# Patient Record
Sex: Female | Born: 1945
Health system: Southern US, Community
[De-identification: ages and names within clinical notes are randomized; demographics above are authoritative.]

## PROBLEM LIST (undated history)

## (undated) DIAGNOSIS — R7303 Prediabetes: Secondary | ICD-10-CM

## (undated) DIAGNOSIS — J189 Pneumonia, unspecified organism: Secondary | ICD-10-CM

## (undated) DIAGNOSIS — E785 Hyperlipidemia, unspecified: Secondary | ICD-10-CM

## (undated) DIAGNOSIS — E215 Disorder of parathyroid gland, unspecified: Secondary | ICD-10-CM

## (undated) DIAGNOSIS — A630 Anogenital (venereal) warts: Secondary | ICD-10-CM

## (undated) DIAGNOSIS — G43909 Migraine, unspecified, not intractable, without status migrainosus: Secondary | ICD-10-CM

## (undated) DIAGNOSIS — I10 Essential (primary) hypertension: Secondary | ICD-10-CM

## (undated) DIAGNOSIS — N870 Mild cervical dysplasia: Secondary | ICD-10-CM

## (undated) DIAGNOSIS — A4902 Methicillin resistant Staphylococcus aureus infection, unspecified site: Secondary | ICD-10-CM

## (undated) DIAGNOSIS — F32A Depression, unspecified: Secondary | ICD-10-CM

## (undated) DIAGNOSIS — F329 Major depressive disorder, single episode, unspecified: Secondary | ICD-10-CM

## (undated) DIAGNOSIS — IMO0002 Reserved for concepts with insufficient information to code with codable children: Secondary | ICD-10-CM

## (undated) DIAGNOSIS — N939 Abnormal uterine and vaginal bleeding, unspecified: Secondary | ICD-10-CM

## (undated) HISTORY — PX: GYNECOLOGIC CRYOSURGERY: SHX857

## (undated) HISTORY — DX: Depression, unspecified: F32.A

## (undated) HISTORY — DX: Mild cervical dysplasia: N87.0

## (undated) HISTORY — DX: Essential (primary) hypertension: I10

## (undated) HISTORY — PX: TOTAL HIP ARTHROPLASTY: SHX124

## (undated) HISTORY — DX: Reserved for concepts with insufficient information to code with codable children: IMO0002

## (undated) HISTORY — PX: CERVICAL SPINE SURGERY: SHX589

## (undated) HISTORY — DX: Hypercalcemia: E83.52

## (undated) HISTORY — DX: Methicillin resistant Staphylococcus aureus infection, unspecified site: A49.02

## (undated) HISTORY — DX: Major depressive disorder, single episode, unspecified: F32.9

## (undated) HISTORY — DX: Anogenital (venereal) warts: A63.0

## (undated) HISTORY — DX: Abnormal uterine and vaginal bleeding, unspecified: N93.9

## (undated) HISTORY — DX: Hyperlipidemia, unspecified: E78.5

## (undated) HISTORY — DX: Migraine, unspecified, not intractable, without status migrainosus: G43.909

---

## 1998-05-05 ENCOUNTER — Ambulatory Visit (HOSPITAL_COMMUNITY): Admission: RE | Admit: 1998-05-05 | Discharge: 1998-05-05 | Payer: Self-pay | Admitting: Obstetrics and Gynecology

## 1998-05-27 ENCOUNTER — Ambulatory Visit (HOSPITAL_COMMUNITY): Admission: RE | Admit: 1998-05-27 | Discharge: 1998-05-27 | Payer: Self-pay | Admitting: Obstetrics and Gynecology

## 1998-08-12 ENCOUNTER — Encounter: Payer: Self-pay | Admitting: Internal Medicine

## 1998-08-12 ENCOUNTER — Encounter: Admission: RE | Admit: 1998-08-12 | Discharge: 1998-11-10 | Payer: Self-pay | Admitting: Internal Medicine

## 1998-08-25 ENCOUNTER — Encounter: Admission: RE | Admit: 1998-08-25 | Discharge: 1998-11-23 | Payer: Self-pay | Admitting: *Deleted

## 1999-05-10 ENCOUNTER — Encounter: Payer: Self-pay | Admitting: Obstetrics and Gynecology

## 1999-05-10 ENCOUNTER — Ambulatory Visit (HOSPITAL_COMMUNITY): Admission: RE | Admit: 1999-05-10 | Discharge: 1999-05-10 | Payer: Self-pay | Admitting: Obstetrics and Gynecology

## 1999-06-06 ENCOUNTER — Other Ambulatory Visit: Admission: RE | Admit: 1999-06-06 | Discharge: 1999-06-06 | Payer: Self-pay | Admitting: Obstetrics and Gynecology

## 1999-12-01 ENCOUNTER — Encounter: Admission: RE | Admit: 1999-12-01 | Discharge: 1999-12-29 | Payer: Self-pay | Admitting: *Deleted

## 2000-05-16 ENCOUNTER — Ambulatory Visit (HOSPITAL_COMMUNITY): Admission: RE | Admit: 2000-05-16 | Discharge: 2000-05-16 | Payer: Self-pay | Admitting: Obstetrics and Gynecology

## 2000-05-16 ENCOUNTER — Encounter: Payer: Self-pay | Admitting: Obstetrics and Gynecology

## 2000-06-07 ENCOUNTER — Other Ambulatory Visit: Admission: RE | Admit: 2000-06-07 | Discharge: 2000-06-07 | Payer: Self-pay | Admitting: Obstetrics and Gynecology

## 2001-04-30 ENCOUNTER — Emergency Department (HOSPITAL_COMMUNITY): Admission: EM | Admit: 2001-04-30 | Discharge: 2001-04-30 | Payer: Self-pay | Admitting: Emergency Medicine

## 2001-04-30 ENCOUNTER — Encounter: Payer: Self-pay | Admitting: Emergency Medicine

## 2001-05-20 ENCOUNTER — Encounter: Payer: Self-pay | Admitting: Obstetrics and Gynecology

## 2001-05-20 ENCOUNTER — Ambulatory Visit (HOSPITAL_COMMUNITY): Admission: RE | Admit: 2001-05-20 | Discharge: 2001-05-20 | Payer: Self-pay | Admitting: Obstetrics and Gynecology

## 2001-06-07 ENCOUNTER — Other Ambulatory Visit: Admission: RE | Admit: 2001-06-07 | Discharge: 2001-06-07 | Payer: Self-pay | Admitting: Obstetrics and Gynecology

## 2001-06-14 ENCOUNTER — Encounter: Payer: Self-pay | Admitting: Obstetrics and Gynecology

## 2001-06-14 ENCOUNTER — Encounter: Admission: RE | Admit: 2001-06-14 | Discharge: 2001-06-14 | Payer: Self-pay | Admitting: Obstetrics and Gynecology

## 2002-02-24 ENCOUNTER — Ambulatory Visit (HOSPITAL_COMMUNITY): Admission: RE | Admit: 2002-02-24 | Discharge: 2002-02-24 | Payer: Self-pay | Admitting: Orthopedic Surgery

## 2002-02-24 ENCOUNTER — Encounter: Payer: Self-pay | Admitting: Orthopedic Surgery

## 2002-03-31 ENCOUNTER — Encounter: Admission: RE | Admit: 2002-03-31 | Discharge: 2002-06-26 | Payer: Self-pay | Admitting: Orthopedic Surgery

## 2002-06-05 ENCOUNTER — Ambulatory Visit (HOSPITAL_COMMUNITY): Admission: RE | Admit: 2002-06-05 | Discharge: 2002-06-05 | Payer: Self-pay | Admitting: Obstetrics and Gynecology

## 2002-06-05 ENCOUNTER — Encounter: Payer: Self-pay | Admitting: Obstetrics and Gynecology

## 2002-07-09 ENCOUNTER — Other Ambulatory Visit: Admission: RE | Admit: 2002-07-09 | Discharge: 2002-07-09 | Payer: Self-pay | Admitting: Obstetrics and Gynecology

## 2002-09-04 ENCOUNTER — Encounter: Payer: Self-pay | Admitting: Neurosurgery

## 2002-09-04 ENCOUNTER — Ambulatory Visit (HOSPITAL_COMMUNITY): Admission: RE | Admit: 2002-09-04 | Discharge: 2002-09-04 | Payer: Self-pay | Admitting: Neurosurgery

## 2002-09-05 ENCOUNTER — Encounter: Payer: Self-pay | Admitting: Neurosurgery

## 2002-09-05 ENCOUNTER — Ambulatory Visit (HOSPITAL_COMMUNITY): Admission: RE | Admit: 2002-09-05 | Discharge: 2002-09-05 | Payer: Self-pay | Admitting: Neurosurgery

## 2002-09-10 ENCOUNTER — Encounter: Payer: Self-pay | Admitting: Neurosurgery

## 2002-09-12 ENCOUNTER — Encounter: Payer: Self-pay | Admitting: Neurosurgery

## 2002-09-12 ENCOUNTER — Inpatient Hospital Stay (HOSPITAL_COMMUNITY): Admission: RE | Admit: 2002-09-12 | Discharge: 2002-09-14 | Payer: Self-pay | Admitting: Neurosurgery

## 2003-06-09 ENCOUNTER — Ambulatory Visit (HOSPITAL_COMMUNITY): Admission: RE | Admit: 2003-06-09 | Discharge: 2003-06-09 | Payer: Self-pay | Admitting: Obstetrics and Gynecology

## 2003-06-09 ENCOUNTER — Encounter: Payer: Self-pay | Admitting: Obstetrics and Gynecology

## 2003-07-14 ENCOUNTER — Other Ambulatory Visit: Admission: RE | Admit: 2003-07-14 | Discharge: 2003-07-14 | Payer: Self-pay | Admitting: Obstetrics and Gynecology

## 2003-10-07 ENCOUNTER — Ambulatory Visit (HOSPITAL_COMMUNITY): Admission: RE | Admit: 2003-10-07 | Discharge: 2003-10-07 | Payer: Self-pay | Admitting: Gastroenterology

## 2004-07-11 ENCOUNTER — Ambulatory Visit (HOSPITAL_COMMUNITY): Admission: RE | Admit: 2004-07-11 | Discharge: 2004-07-11 | Payer: Self-pay | Admitting: Obstetrics and Gynecology

## 2004-07-18 ENCOUNTER — Other Ambulatory Visit: Admission: RE | Admit: 2004-07-18 | Discharge: 2004-07-18 | Payer: Self-pay | Admitting: Obstetrics and Gynecology

## 2005-07-12 ENCOUNTER — Ambulatory Visit (HOSPITAL_COMMUNITY): Admission: RE | Admit: 2005-07-12 | Discharge: 2005-07-12 | Payer: Self-pay | Admitting: Obstetrics and Gynecology

## 2005-07-19 ENCOUNTER — Other Ambulatory Visit: Admission: RE | Admit: 2005-07-19 | Discharge: 2005-07-19 | Payer: Self-pay | Admitting: Obstetrics and Gynecology

## 2005-09-27 ENCOUNTER — Encounter: Admission: RE | Admit: 2005-09-27 | Discharge: 2005-09-27 | Payer: Self-pay | Admitting: Occupational Medicine

## 2006-07-17 ENCOUNTER — Ambulatory Visit (HOSPITAL_COMMUNITY): Admission: RE | Admit: 2006-07-17 | Discharge: 2006-07-17 | Payer: Self-pay | Admitting: Obstetrics and Gynecology

## 2006-07-23 ENCOUNTER — Other Ambulatory Visit: Admission: RE | Admit: 2006-07-23 | Discharge: 2006-07-23 | Payer: Self-pay | Admitting: Obstetrics and Gynecology

## 2007-07-30 ENCOUNTER — Ambulatory Visit (HOSPITAL_COMMUNITY): Admission: RE | Admit: 2007-07-30 | Discharge: 2007-07-30 | Payer: Self-pay | Admitting: Obstetrics and Gynecology

## 2007-10-24 DIAGNOSIS — A4902 Methicillin resistant Staphylococcus aureus infection, unspecified site: Secondary | ICD-10-CM

## 2007-10-24 HISTORY — DX: Methicillin resistant Staphylococcus aureus infection, unspecified site: A49.02

## 2007-10-24 HISTORY — PX: SPINE SURGERY: SHX786

## 2008-04-14 ENCOUNTER — Ambulatory Visit (HOSPITAL_COMMUNITY): Admission: RE | Admit: 2008-04-14 | Discharge: 2008-04-14 | Payer: Self-pay | Admitting: Neurosurgery

## 2008-04-23 ENCOUNTER — Ambulatory Visit (HOSPITAL_COMMUNITY): Admission: RE | Admit: 2008-04-23 | Discharge: 2008-04-23 | Payer: Self-pay | Admitting: Neurosurgery

## 2008-07-30 ENCOUNTER — Ambulatory Visit (HOSPITAL_COMMUNITY): Admission: RE | Admit: 2008-07-30 | Discharge: 2008-07-30 | Payer: Self-pay | Admitting: Obstetrics and Gynecology

## 2008-09-08 ENCOUNTER — Inpatient Hospital Stay (HOSPITAL_COMMUNITY): Admission: RE | Admit: 2008-09-08 | Discharge: 2008-09-12 | Payer: Self-pay | Admitting: Neurosurgery

## 2008-10-09 ENCOUNTER — Ambulatory Visit: Payer: Self-pay | Admitting: Internal Medicine

## 2008-10-09 ENCOUNTER — Inpatient Hospital Stay (HOSPITAL_COMMUNITY): Admission: EM | Admit: 2008-10-09 | Discharge: 2008-10-20 | Payer: Self-pay | Admitting: Internal Medicine

## 2008-10-09 ENCOUNTER — Ambulatory Visit: Payer: Self-pay | Admitting: Diagnostic Radiology

## 2008-10-09 ENCOUNTER — Encounter: Payer: Self-pay | Admitting: Emergency Medicine

## 2008-10-11 DIAGNOSIS — L089 Local infection of the skin and subcutaneous tissue, unspecified: Secondary | ICD-10-CM | POA: Insufficient documentation

## 2008-10-12 ENCOUNTER — Ambulatory Visit: Payer: Self-pay | Admitting: Infectious Diseases

## 2008-10-30 ENCOUNTER — Ambulatory Visit: Payer: Self-pay | Admitting: Internal Medicine

## 2008-11-02 ENCOUNTER — Encounter: Payer: Self-pay | Admitting: Internal Medicine

## 2008-11-09 ENCOUNTER — Inpatient Hospital Stay (HOSPITAL_COMMUNITY): Admission: AD | Admit: 2008-11-09 | Discharge: 2008-11-23 | Payer: Self-pay | Admitting: Neurosurgery

## 2008-11-09 ENCOUNTER — Ambulatory Visit: Payer: Self-pay | Admitting: Pulmonary Disease

## 2008-11-09 ENCOUNTER — Encounter: Payer: Self-pay | Admitting: Infectious Diseases

## 2008-11-14 ENCOUNTER — Ambulatory Visit: Payer: Self-pay | Admitting: Vascular Surgery

## 2008-11-14 ENCOUNTER — Encounter: Payer: Self-pay | Admitting: Critical Care Medicine

## 2008-11-14 HISTORY — PX: OTHER SURGICAL HISTORY: SHX169

## 2008-11-17 ENCOUNTER — Encounter (INDEPENDENT_AMBULATORY_CARE_PROVIDER_SITE_OTHER): Payer: Self-pay | Admitting: Neurosurgery

## 2008-11-25 ENCOUNTER — Encounter: Payer: Self-pay | Admitting: Infectious Diseases

## 2008-11-30 ENCOUNTER — Encounter: Payer: Self-pay | Admitting: Infectious Diseases

## 2008-12-02 ENCOUNTER — Ambulatory Visit: Payer: Self-pay | Admitting: Infectious Diseases

## 2008-12-02 DIAGNOSIS — E785 Hyperlipidemia, unspecified: Secondary | ICD-10-CM

## 2008-12-04 ENCOUNTER — Encounter: Payer: Self-pay | Admitting: Infectious Diseases

## 2008-12-05 ENCOUNTER — Ambulatory Visit (HOSPITAL_COMMUNITY): Admission: RE | Admit: 2008-12-05 | Discharge: 2008-12-05 | Payer: Self-pay | Admitting: Neurosurgery

## 2008-12-15 ENCOUNTER — Encounter: Payer: Self-pay | Admitting: Infectious Diseases

## 2008-12-16 ENCOUNTER — Ambulatory Visit: Payer: Self-pay | Admitting: Infectious Diseases

## 2008-12-24 ENCOUNTER — Observation Stay (HOSPITAL_COMMUNITY): Admission: RE | Admit: 2008-12-24 | Discharge: 2008-12-25 | Payer: Self-pay | Admitting: Neurosurgery

## 2009-01-27 ENCOUNTER — Ambulatory Visit: Payer: Self-pay | Admitting: Infectious Diseases

## 2009-01-27 LAB — CONVERTED CEMR LAB
ALT: 10 units/L (ref 0–35)
Basophils Absolute: 0 10*3/uL (ref 0.0–0.1)
Basophils Relative: 1 % (ref 0–1)
CO2: 22 meq/L (ref 19–32)
Calcium: 11.3 mg/dL — ABNORMAL HIGH (ref 8.4–10.5)
Chloride: 105 meq/L (ref 96–112)
Cholesterol: 296 mg/dL — ABNORMAL HIGH (ref 0–200)
Creatinine, Ser: 0.84 mg/dL (ref 0.40–1.20)
Eosinophils Relative: 8 % — ABNORMAL HIGH (ref 0–5)
Glucose, Bld: 66 mg/dL — ABNORMAL LOW (ref 70–99)
HCT: 35.9 % — ABNORMAL LOW (ref 36.0–46.0)
Hemoglobin: 11.5 g/dL — ABNORMAL LOW (ref 12.0–15.0)
MCHC: 32 g/dL (ref 30.0–36.0)
MCV: 85.7 fL (ref 78.0–100.0)
Monocytes Absolute: 0.6 10*3/uL (ref 0.1–1.0)
Monocytes Relative: 8 % (ref 3–12)
RBC: 4.19 M/uL (ref 3.87–5.11)
RDW: 15.5 % (ref 11.5–15.5)
Sed Rate: 43 mm/hr — ABNORMAL HIGH (ref 0–22)
Sodium: 140 meq/L (ref 135–145)
Total Bilirubin: 0.3 mg/dL (ref 0.3–1.2)
Total Protein: 8.4 g/dL — ABNORMAL HIGH (ref 6.0–8.3)
Triglycerides: 295 mg/dL — ABNORMAL HIGH (ref <150)
VLDL: 59 mg/dL — ABNORMAL HIGH (ref 0–40)

## 2009-02-03 ENCOUNTER — Telehealth: Payer: Self-pay | Admitting: Infectious Diseases

## 2009-06-11 ENCOUNTER — Ambulatory Visit (HOSPITAL_COMMUNITY): Admission: RE | Admit: 2009-06-11 | Discharge: 2009-06-11 | Payer: Self-pay | Admitting: Neurosurgery

## 2009-07-26 ENCOUNTER — Ambulatory Visit: Payer: Self-pay | Admitting: Infectious Diseases

## 2009-08-03 ENCOUNTER — Encounter (HOSPITAL_BASED_OUTPATIENT_CLINIC_OR_DEPARTMENT_OTHER): Admission: RE | Admit: 2009-08-03 | Discharge: 2009-09-06 | Payer: Self-pay | Admitting: Internal Medicine

## 2009-08-13 ENCOUNTER — Encounter: Payer: Self-pay | Admitting: Infectious Diseases

## 2009-08-25 ENCOUNTER — Ambulatory Visit (HOSPITAL_COMMUNITY): Admission: RE | Admit: 2009-08-25 | Discharge: 2009-08-25 | Payer: Self-pay | Admitting: Obstetrics and Gynecology

## 2009-11-26 ENCOUNTER — Encounter: Payer: Self-pay | Admitting: Infectious Diseases

## 2009-12-03 ENCOUNTER — Telehealth: Payer: Self-pay | Admitting: Infectious Diseases

## 2009-12-16 IMAGING — CR DG LUMBAR SPINE COMPLETE W/ BEND
7 series · 7 of 7 positions shown · non-contrast
Comparison: MR 04/15/2008.

CLINICAL DATA: Left-sided back pain radiating down left leg.  No
history of trauma provided.

LUMBAR SPINE - COMPLETE WITH BENDING VIEWS

[w l-spine flexion/extension (1 of 2)]
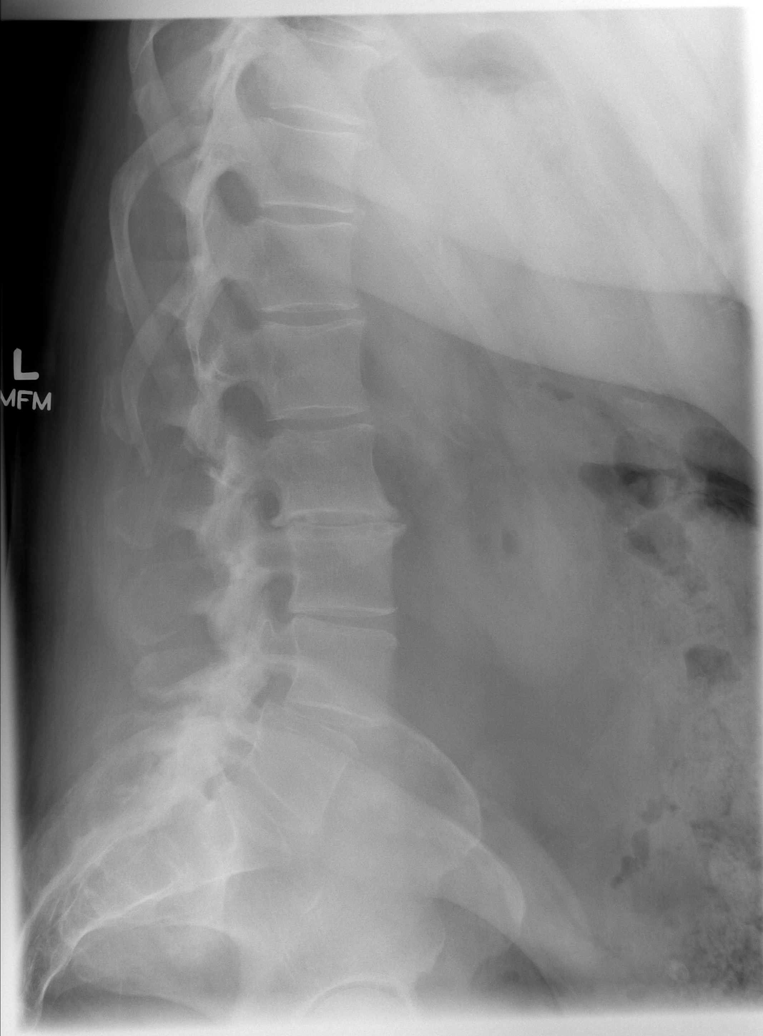

[w l-spine flexion/extension (2 of 2)]
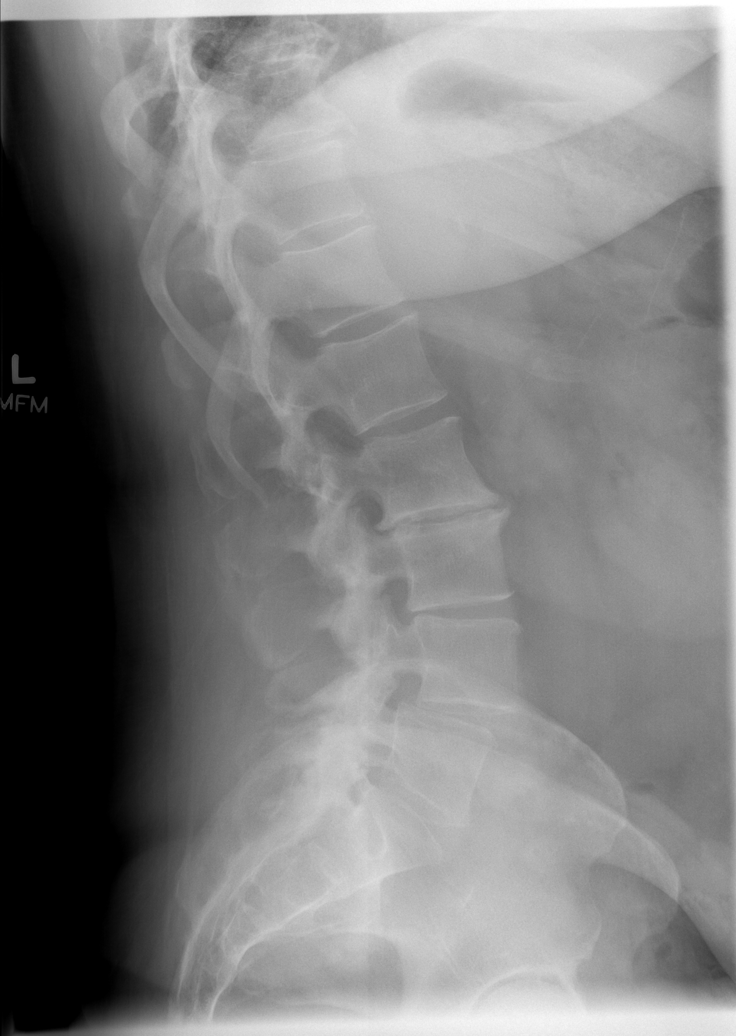

[t l-spine a.p.]
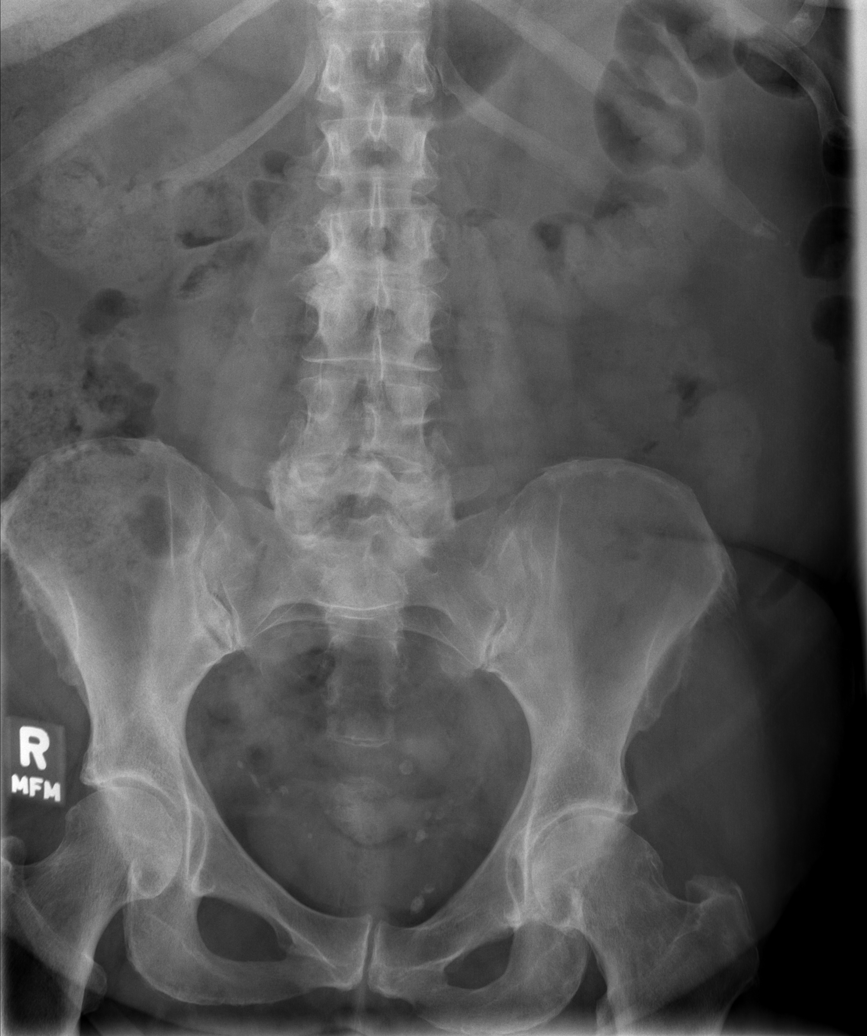

[t l-spine oblique exposure (1 of 2)]
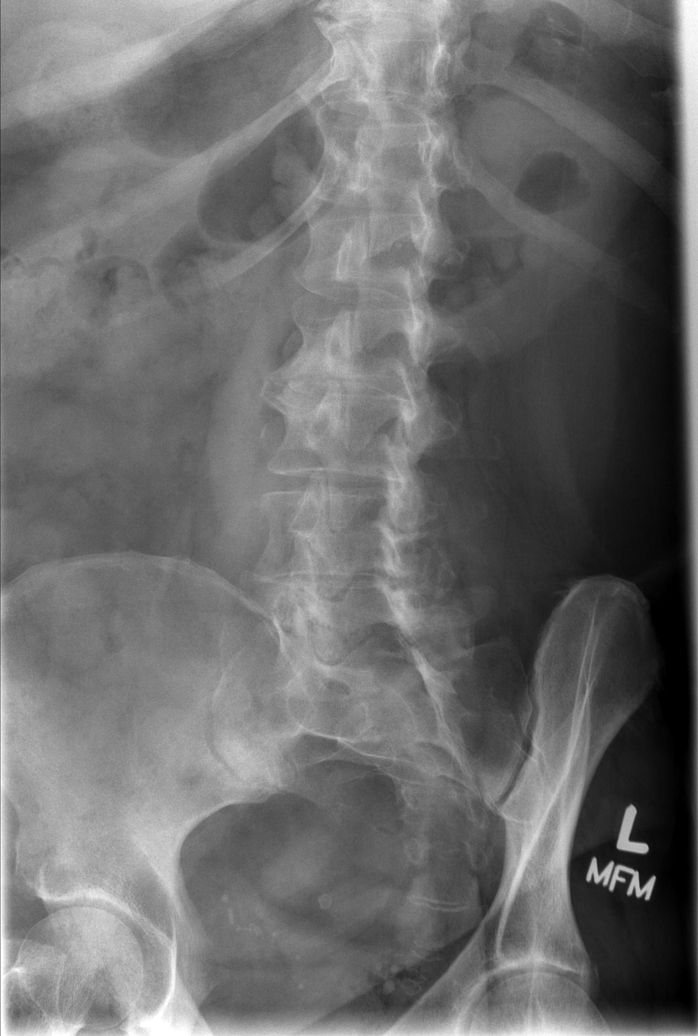

[t l-spine oblique exposure (2 of 2)]
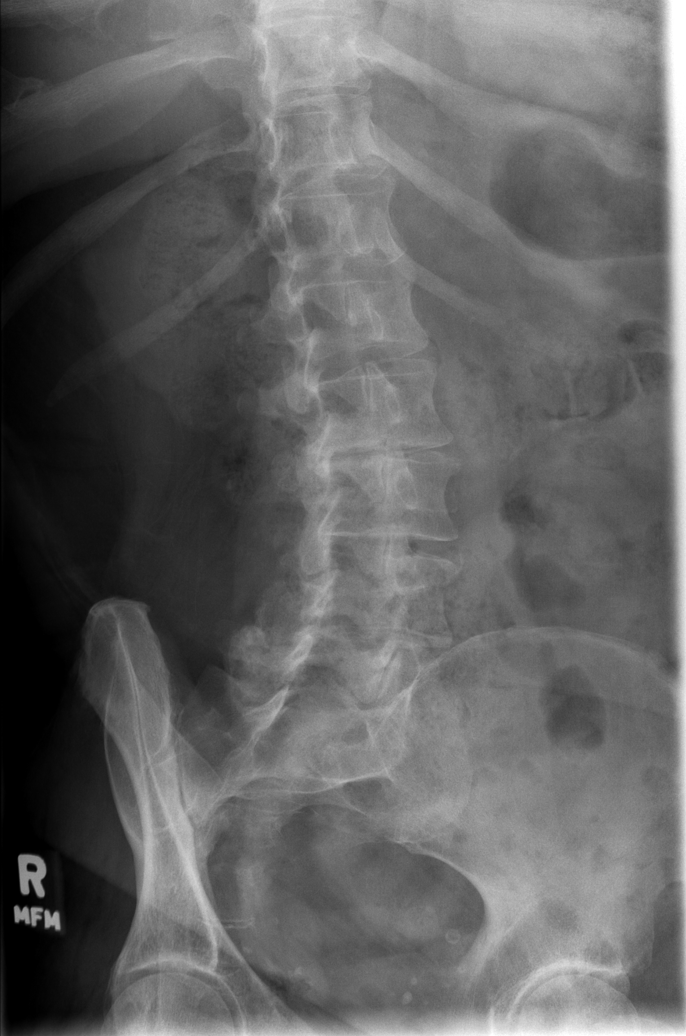

[t l-spine lat]
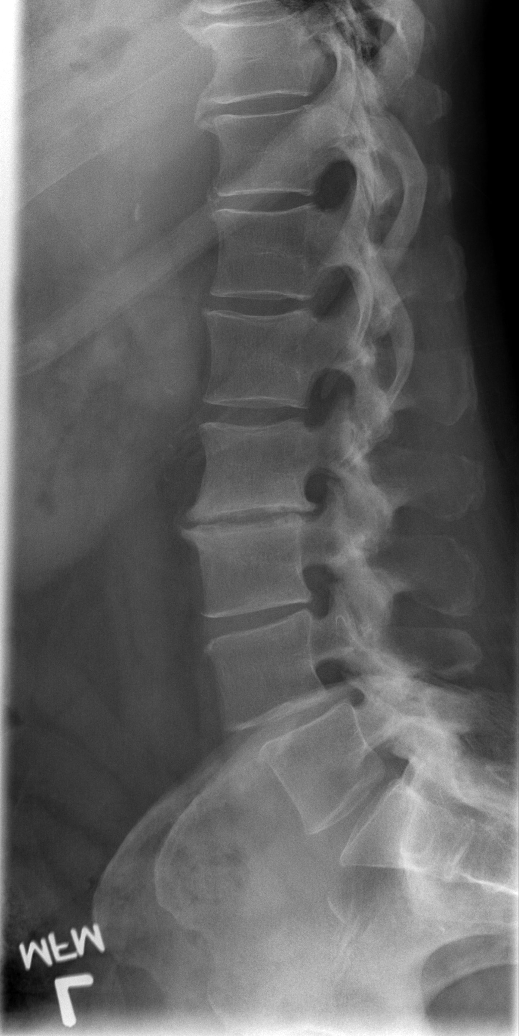

[t l-spine l5-s1 spot]
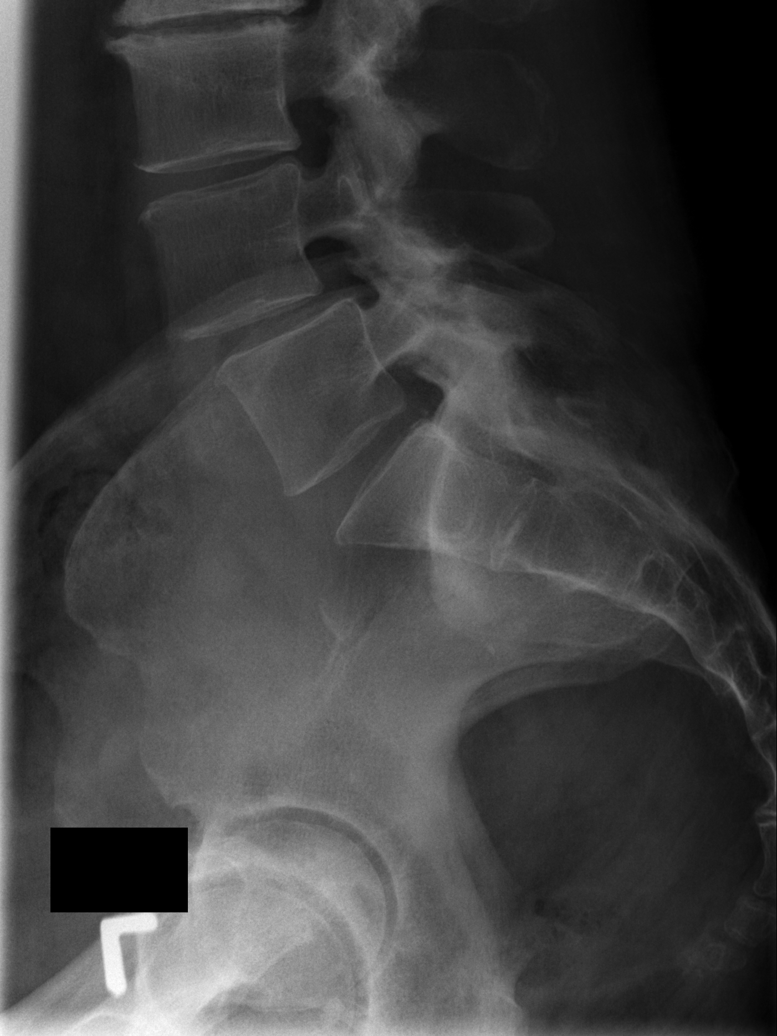

[7 of 7 positions shown; findings below may reference images not displayed]

FINDINGS: Marked L2-3 degenerative disc disease with disc space
narrowing , right greater left and osteophyte formation.

Moderate L3-4 degenerative disc disease and disc space narrowing.

Bilateral facet joint degenerative changes throughout the lumbar
spine most notable lower lumbar region.  Grade 1 anterior slip L4
upon L5 by 5.7 mm.  No abnormal motion occurs between flexion
andextension.

Bilateral hip joint degenerative changes left greater right.
IMPRESSION: Marked L2-3 disc degeneration and osteophyte formation.

Moderate L3-4 disc degeneration disc space narrowing.

Grade 1 anterior slip L4 on L 5 by 5.7 mm.  No abnormal motion
between flexion and extension.

Bilateral hip joint degenerative changes left greater right.

## 2010-06-20 ENCOUNTER — Ambulatory Visit (HOSPITAL_COMMUNITY): Admission: RE | Admit: 2010-06-20 | Discharge: 2010-06-20 | Payer: Self-pay | Admitting: Neurosurgery

## 2010-07-08 ENCOUNTER — Encounter: Admission: RE | Admit: 2010-07-08 | Discharge: 2010-07-08 | Payer: Self-pay | Admitting: Neurosurgery

## 2010-09-06 ENCOUNTER — Ambulatory Visit (HOSPITAL_COMMUNITY): Admission: RE | Admit: 2010-09-06 | Discharge: 2010-09-06 | Payer: Self-pay | Admitting: Obstetrics and Gynecology

## 2010-10-03 ENCOUNTER — Encounter
Admission: RE | Admit: 2010-10-03 | Discharge: 2010-10-03 | Payer: Self-pay | Source: Home / Self Care | Attending: Neurosurgery | Admitting: Neurosurgery

## 2010-10-12 ENCOUNTER — Emergency Department (HOSPITAL_COMMUNITY)
Admission: EM | Admit: 2010-10-12 | Discharge: 2010-10-12 | Payer: Self-pay | Source: Home / Self Care | Admitting: Family Medicine

## 2010-11-13 ENCOUNTER — Encounter: Payer: Self-pay | Admitting: Internal Medicine

## 2010-11-22 NOTE — Progress Notes (Signed)
Summary: Questions about doxycycline  Phone Note Call from Patient   Summary of Call: Pt was to take Doxycycline for one year starting last March.  She has one refill left and wonders if she is to take it. 2. She requested that Dr Fredirick Maudlin office forward labs that were done last week at his office.  A cholesterol medication was added  to her regimen. 3. If she needs a f/u OV please call so she can schedule the visit.

## 2010-12-28 ENCOUNTER — Inpatient Hospital Stay (INDEPENDENT_AMBULATORY_CARE_PROVIDER_SITE_OTHER)
Admission: RE | Admit: 2010-12-28 | Discharge: 2010-12-28 | Disposition: A | Discharge status: Home / Self Care | Payer: 59 | Source: Ambulatory Visit | Attending: Family Medicine | Admitting: Family Medicine

## 2010-12-28 DIAGNOSIS — J40 Bronchitis, not specified as acute or chronic: Secondary | ICD-10-CM

## 2011-01-06 LAB — CREATININE, SERUM
Creatinine, Ser: 1.02 mg/dL (ref 0.4–1.2)
GFR calc Af Amer: >60 mL/min (ref >60)

## 2011-01-12 ENCOUNTER — Other Ambulatory Visit: Payer: Self-pay | Admitting: Dermatology

## 2011-02-02 LAB — CBC
HCT: 30.3 % — ABNORMAL LOW (ref 36.0–46.0)
Hemoglobin: 10.4 g/dL — ABNORMAL LOW (ref 12.0–15.0)
RBC: 3.54 MIL/uL — ABNORMAL LOW (ref 3.87–5.11)
WBC: 7.8 10*3/uL (ref 4.0–10.5)

## 2011-02-02 LAB — BASIC METABOLIC PANEL
Calcium: 11.1 mg/dL — ABNORMAL HIGH (ref 8.4–10.5)
GFR calc Af Amer: >60 mL/min (ref >60)
GFR calc non Af Amer: 59 mL/min — ABNORMAL LOW (ref >60)
Potassium: 4.2 mEq/L (ref 3.5–5.1)
Sodium: 136 mEq/L (ref 135–145)

## 2011-02-06 LAB — COMPREHENSIVE METABOLIC PANEL
ALT: 10 U/L (ref 0–35)
ALT: 21 U/L (ref 0–35)
AST: 14 U/L (ref 0–37)
Alkaline Phosphatase: 52 U/L (ref 39–117)
BUN: 13 mg/dL (ref 6–23)
BUN: 16 mg/dL (ref 6–23)
CO2: 18 mEq/L — ABNORMAL LOW (ref 19–32)
CO2: 18 mEq/L — ABNORMAL LOW (ref 19–32)
CO2: 24 mEq/L (ref 19–32)
Calcium: 8.8 mg/dL (ref 8.4–10.5)
Calcium: 8.8 mg/dL (ref 8.4–10.5)
Calcium: 9.9 mg/dL (ref 8.4–10.5)
Creatinine, Ser: 1.51 mg/dL — ABNORMAL HIGH (ref 0.4–1.2)
Creatinine, Ser: 1.59 mg/dL — ABNORMAL HIGH (ref 0.4–1.2)
GFR calc Af Amer: 43 mL/min — ABNORMAL LOW (ref >60)
GFR calc non Af Amer: 33 mL/min — ABNORMAL LOW (ref >60)
GFR calc non Af Amer: 35 mL/min — ABNORMAL LOW (ref >60)
GFR calc non Af Amer: 36 mL/min — ABNORMAL LOW (ref >60)
Glucose, Bld: 105 mg/dL — ABNORMAL HIGH (ref 70–99)
Glucose, Bld: 149 mg/dL — ABNORMAL HIGH (ref 70–99)
Glucose, Bld: 84 mg/dL (ref 70–99)
Potassium: 3.4 mEq/L — ABNORMAL LOW (ref 3.5–5.1)
Sodium: 130 mEq/L — ABNORMAL LOW (ref 135–145)
Sodium: 139 mEq/L (ref 135–145)
Total Protein: 4 g/dL — ABNORMAL LOW (ref 6.0–8.3)
Total Protein: 4.2 g/dL — ABNORMAL LOW (ref 6.0–8.3)

## 2011-02-06 LAB — DIFFERENTIAL
Basophils Absolute: 0 10*3/uL (ref 0.0–0.1)
Basophils Absolute: 0 10*3/uL (ref 0.0–0.1)
Basophils Absolute: 0.1 10*3/uL (ref 0.0–0.1)
Basophils Absolute: 0.2 10*3/uL — ABNORMAL HIGH (ref 0.0–0.1)
Basophils Relative: 1 % (ref 0–1)
Basophils Relative: 1 % (ref 0–1)
Eosinophils Absolute: 1.1 10*3/uL — ABNORMAL HIGH (ref 0.0–0.7)
Eosinophils Absolute: 2.9 10*3/uL — ABNORMAL HIGH (ref 0.0–0.7)
Eosinophils Absolute: 3.6 10*3/uL — ABNORMAL HIGH (ref 0.0–0.7)
Eosinophils Absolute: 5.4 10*3/uL — ABNORMAL HIGH (ref 0.0–0.7)
Eosinophils Relative: 13 % — ABNORMAL HIGH (ref 0–5)
Eosinophils Relative: 15 % — ABNORMAL HIGH (ref 0–5)
Eosinophils Relative: 15 % — ABNORMAL HIGH (ref 0–5)
Eosinophils Relative: 20 % — ABNORMAL HIGH (ref 0–5)
Lymphocytes Relative: 15 % (ref 12–46)
Lymphocytes Relative: 7 % — ABNORMAL LOW (ref 12–46)
Lymphocytes Relative: 7 % — ABNORMAL LOW (ref 12–46)
Lymphs Abs: 0.6 10*3/uL — ABNORMAL LOW (ref 0.7–4.0)
Lymphs Abs: 0.8 10*3/uL (ref 0.7–4.0)
Lymphs Abs: 4.1 10*3/uL — ABNORMAL HIGH (ref 0.7–4.0)
Lymphs Abs: 5.4 10*3/uL — ABNORMAL HIGH (ref 0.7–4.0)
Monocytes Absolute: 1.2 10*3/uL — ABNORMAL HIGH (ref 0.1–1.0)
Monocytes Relative: 3 % (ref 3–12)
Monocytes Relative: 5 % (ref 3–12)
Neutro Abs: 10.5 10*3/uL — ABNORMAL HIGH (ref 1.7–7.7)
Neutro Abs: 11.9 10*3/uL — ABNORMAL HIGH (ref 1.7–7.7)
Neutro Abs: 8.3 10*3/uL — ABNORMAL HIGH (ref 1.7–7.7)
Neutrophils Relative %: 48 % (ref 43–77)
Neutrophils Relative %: 53 % (ref 43–77)
Neutrophils Relative %: 73 % (ref 43–77)
Neutrophils Relative %: 80 % — ABNORMAL HIGH (ref 43–77)
WBC Morphology: INCREASED

## 2011-02-06 LAB — GLUCOSE, CAPILLARY
Glucose-Capillary: 100 mg/dL — ABNORMAL HIGH (ref 70–99)
Glucose-Capillary: 101 mg/dL — ABNORMAL HIGH (ref 70–99)
Glucose-Capillary: 101 mg/dL — ABNORMAL HIGH (ref 70–99)
Glucose-Capillary: 103 mg/dL — ABNORMAL HIGH (ref 70–99)
Glucose-Capillary: 106 mg/dL — ABNORMAL HIGH (ref 70–99)
Glucose-Capillary: 106 mg/dL — ABNORMAL HIGH (ref 70–99)
Glucose-Capillary: 108 mg/dL — ABNORMAL HIGH (ref 70–99)
Glucose-Capillary: 116 mg/dL — ABNORMAL HIGH (ref 70–99)
Glucose-Capillary: 120 mg/dL — ABNORMAL HIGH (ref 70–99)
Glucose-Capillary: 130 mg/dL — ABNORMAL HIGH (ref 70–99)
Glucose-Capillary: 131 mg/dL — ABNORMAL HIGH (ref 70–99)
Glucose-Capillary: 134 mg/dL — ABNORMAL HIGH (ref 70–99)
Glucose-Capillary: 134 mg/dL — ABNORMAL HIGH (ref 70–99)
Glucose-Capillary: 135 mg/dL — ABNORMAL HIGH (ref 70–99)
Glucose-Capillary: 184 mg/dL — ABNORMAL HIGH (ref 70–99)
Glucose-Capillary: 78 mg/dL (ref 70–99)
Glucose-Capillary: 79 mg/dL (ref 70–99)
Glucose-Capillary: 80 mg/dL (ref 70–99)
Glucose-Capillary: 87 mg/dL (ref 70–99)
Glucose-Capillary: 90 mg/dL (ref 70–99)
Glucose-Capillary: 91 mg/dL (ref 70–99)
Glucose-Capillary: 91 mg/dL (ref 70–99)
Glucose-Capillary: 94 mg/dL (ref 70–99)

## 2011-02-06 LAB — CBC
HCT: 23.3 % — ABNORMAL LOW (ref 36.0–46.0)
HCT: 23.4 % — ABNORMAL LOW (ref 36.0–46.0)
HCT: 24 % — ABNORMAL LOW (ref 36.0–46.0)
HCT: 24.7 % — ABNORMAL LOW (ref 36.0–46.0)
HCT: 24.7 % — ABNORMAL LOW (ref 36.0–46.0)
HCT: 27.3 % — ABNORMAL LOW (ref 36.0–46.0)
HCT: 27.3 % — ABNORMAL LOW (ref 36.0–46.0)
HCT: 33.3 % — ABNORMAL LOW (ref 36.0–46.0)
Hemoglobin: 10.4 g/dL — ABNORMAL LOW (ref 12.0–15.0)
Hemoglobin: 11.2 g/dL — ABNORMAL LOW (ref 12.0–15.0)
Hemoglobin: 7.6 g/dL — CL (ref 12.0–15.0)
Hemoglobin: 7.9 g/dL — CL (ref 12.0–15.0)
Hemoglobin: 8.2 g/dL — ABNORMAL LOW (ref 12.0–15.0)
Hemoglobin: 8.6 g/dL — ABNORMAL LOW (ref 12.0–15.0)
Hemoglobin: 9.4 g/dL — ABNORMAL LOW (ref 12.0–15.0)
MCHC: 33.1 g/dL (ref 30.0–36.0)
MCHC: 33.2 g/dL (ref 30.0–36.0)
MCHC: 33.2 g/dL (ref 30.0–36.0)
MCHC: 33.6 g/dL (ref 30.0–36.0)
MCHC: 33.6 g/dL (ref 30.0–36.0)
MCHC: 34 g/dL (ref 30.0–36.0)
MCHC: 34.4 g/dL (ref 30.0–36.0)
MCHC: 34.6 g/dL (ref 30.0–36.0)
MCV: 86.9 fL (ref 78.0–100.0)
MCV: 87.8 fL (ref 78.0–100.0)
MCV: 88.6 fL (ref 78.0–100.0)
MCV: 88.6 fL (ref 78.0–100.0)
MCV: 88.8 fL (ref 78.0–100.0)
Platelets: 146 10*3/uL — ABNORMAL LOW (ref 150–400)
Platelets: 207 10*3/uL (ref 150–400)
Platelets: 224 10*3/uL (ref 150–400)
Platelets: 266 10*3/uL (ref 150–400)
Platelets: 278 10*3/uL (ref 150–400)
RBC: 2.56 MIL/uL — ABNORMAL LOW (ref 3.87–5.11)
RBC: 2.69 MIL/uL — ABNORMAL LOW (ref 3.87–5.11)
RBC: 2.73 MIL/uL — ABNORMAL LOW (ref 3.87–5.11)
RBC: 2.84 MIL/uL — ABNORMAL LOW (ref 3.87–5.11)
RBC: 2.91 MIL/uL — ABNORMAL LOW (ref 3.87–5.11)
RBC: 3.13 MIL/uL — ABNORMAL LOW (ref 3.87–5.11)
RBC: 3.48 MIL/uL — ABNORMAL LOW (ref 3.87–5.11)
RBC: 3.76 MIL/uL — ABNORMAL LOW (ref 3.87–5.11)
RDW: 14.2 % (ref 11.5–15.5)
RDW: 15.4 % (ref 11.5–15.5)
RDW: 15.5 % (ref 11.5–15.5)
RDW: 15.7 % — ABNORMAL HIGH (ref 11.5–15.5)
RDW: 16 % — ABNORMAL HIGH (ref 11.5–15.5)
WBC: 11.2 10*3/uL — ABNORMAL HIGH (ref 4.0–10.5)
WBC: 11.6 10*3/uL — ABNORMAL HIGH (ref 4.0–10.5)
WBC: 16 10*3/uL — ABNORMAL HIGH (ref 4.0–10.5)
WBC: 21.8 10*3/uL — ABNORMAL HIGH (ref 4.0–10.5)
WBC: 22.4 10*3/uL — ABNORMAL HIGH (ref 4.0–10.5)

## 2011-02-06 LAB — BASIC METABOLIC PANEL
BUN: 10 mg/dL (ref 6–23)
BUN: 11 mg/dL (ref 6–23)
BUN: 12 mg/dL (ref 6–23)
BUN: 16 mg/dL (ref 6–23)
BUN: 17 mg/dL (ref 6–23)
BUN: 17 mg/dL (ref 6–23)
BUN: 6 mg/dL (ref 6–23)
BUN: 7 mg/dL (ref 6–23)
BUN: 7 mg/dL (ref 6–23)
CO2: 16 mEq/L — ABNORMAL LOW (ref 19–32)
CO2: 17 mEq/L — ABNORMAL LOW (ref 19–32)
CO2: 17 mEq/L — ABNORMAL LOW (ref 19–32)
CO2: 19 mEq/L (ref 19–32)
CO2: 19 mEq/L (ref 19–32)
CO2: 20 mEq/L (ref 19–32)
CO2: 26 mEq/L (ref 19–32)
Calcium: 8.2 mg/dL — ABNORMAL LOW (ref 8.4–10.5)
Calcium: 8.5 mg/dL (ref 8.4–10.5)
Calcium: 8.7 mg/dL (ref 8.4–10.5)
Calcium: 8.9 mg/dL (ref 8.4–10.5)
Calcium: 8.9 mg/dL (ref 8.4–10.5)
Calcium: 9.5 mg/dL (ref 8.4–10.5)
Chloride: 104 mEq/L (ref 96–112)
Chloride: 105 mEq/L (ref 96–112)
Chloride: 108 mEq/L (ref 96–112)
Chloride: 112 mEq/L (ref 96–112)
Chloride: 112 mEq/L (ref 96–112)
Chloride: 115 mEq/L — ABNORMAL HIGH (ref 96–112)
Chloride: 116 mEq/L — ABNORMAL HIGH (ref 96–112)
Creatinine, Ser: 1.11 mg/dL (ref 0.4–1.2)
Creatinine, Ser: 1.16 mg/dL (ref 0.4–1.2)
Creatinine, Ser: 1.21 mg/dL — ABNORMAL HIGH (ref 0.4–1.2)
Creatinine, Ser: 1.38 mg/dL — ABNORMAL HIGH (ref 0.4–1.2)
Creatinine, Ser: 1.59 mg/dL — ABNORMAL HIGH (ref 0.4–1.2)
Creatinine, Ser: 1.73 mg/dL — ABNORMAL HIGH (ref 0.4–1.2)
Creatinine, Ser: 1.81 mg/dL — ABNORMAL HIGH (ref 0.4–1.2)
GFR calc Af Amer: 34 mL/min — ABNORMAL LOW (ref >60)
GFR calc Af Amer: 36 mL/min — ABNORMAL LOW (ref >60)
GFR calc Af Amer: 40 mL/min — ABNORMAL LOW (ref >60)
GFR calc Af Amer: 53 mL/min — ABNORMAL LOW (ref >60)
GFR calc Af Amer: 54 mL/min — ABNORMAL LOW (ref >60)
GFR calc Af Amer: 55 mL/min — ABNORMAL LOW (ref >60)
GFR calc Af Amer: 57 mL/min — ABNORMAL LOW (ref >60)
GFR calc non Af Amer: 25 mL/min — ABNORMAL LOW (ref >60)
GFR calc non Af Amer: 39 mL/min — ABNORMAL LOW (ref >60)
GFR calc non Af Amer: 42 mL/min — ABNORMAL LOW (ref >60)
GFR calc non Af Amer: 44 mL/min — ABNORMAL LOW (ref >60)
GFR calc non Af Amer: 45 mL/min — ABNORMAL LOW (ref >60)
GFR calc non Af Amer: 47 mL/min — ABNORMAL LOW (ref >60)
Glucose, Bld: 119 mg/dL — ABNORMAL HIGH (ref 70–99)
Glucose, Bld: 127 mg/dL — ABNORMAL HIGH (ref 70–99)
Glucose, Bld: 128 mg/dL — ABNORMAL HIGH (ref 70–99)
Glucose, Bld: 184 mg/dL — ABNORMAL HIGH (ref 70–99)
Glucose, Bld: 84 mg/dL (ref 70–99)
Potassium: 2.7 mEq/L — CL (ref 3.5–5.1)
Potassium: 3.3 mEq/L — ABNORMAL LOW (ref 3.5–5.1)
Potassium: 3.3 mEq/L — ABNORMAL LOW (ref 3.5–5.1)
Potassium: 3.4 mEq/L — ABNORMAL LOW (ref 3.5–5.1)
Potassium: 3.4 mEq/L — ABNORMAL LOW (ref 3.5–5.1)
Potassium: 3.5 mEq/L (ref 3.5–5.1)
Potassium: 3.6 mEq/L (ref 3.5–5.1)
Sodium: 129 mEq/L — ABNORMAL LOW (ref 135–145)
Sodium: 136 mEq/L (ref 135–145)
Sodium: 136 mEq/L (ref 135–145)
Sodium: 137 mEq/L (ref 135–145)
Sodium: 141 mEq/L (ref 135–145)

## 2011-02-06 LAB — CULTURE, BLOOD (ROUTINE X 2)
Culture: NO GROWTH
Culture: NO GROWTH
Culture: NO GROWTH

## 2011-02-06 LAB — URINE CULTURE
Colony Count: NO GROWTH
Culture: NO GROWTH
Culture: NO GROWTH

## 2011-02-06 LAB — CULTURE, ROUTINE-ABSCESS

## 2011-02-06 LAB — PROTIME-INR
INR: 1.4 (ref 0.00–1.49)
INR: 1.6 — ABNORMAL HIGH (ref 0.00–1.49)
INR: 1.7 — ABNORMAL HIGH (ref 0.00–1.49)
Prothrombin Time: 19.5 seconds — ABNORMAL HIGH (ref 11.6–15.2)

## 2011-02-06 LAB — URINALYSIS, ROUTINE W REFLEX MICROSCOPIC
Bilirubin Urine: NEGATIVE
Bilirubin Urine: NEGATIVE
Hgb urine dipstick: NEGATIVE
Nitrite: NEGATIVE
Protein, ur: 30 mg/dL — AB
Specific Gravity, Urine: 1.009 (ref 1.005–1.030)
Urobilinogen, UA: 0.2 mg/dL (ref 0.0–1.0)
Urobilinogen, UA: 0.2 mg/dL (ref 0.0–1.0)
pH: 6 (ref 5.0–8.0)

## 2011-02-06 LAB — RETICULOCYTES: Retic Count, Absolute: 61.4 10*3/uL (ref 19.0–186.0)

## 2011-02-06 LAB — GRAM STAIN

## 2011-02-06 LAB — CROSSMATCH

## 2011-02-06 LAB — MAGNESIUM
Magnesium: 1.4 mg/dL — ABNORMAL LOW (ref 1.5–2.5)
Magnesium: 1.7 mg/dL (ref 1.5–2.5)
Magnesium: 2.2 mg/dL (ref 1.5–2.5)

## 2011-02-06 LAB — APTT
aPTT: 34 seconds (ref 24–37)
aPTT: 35 seconds (ref 24–37)
aPTT: 57 seconds — ABNORMAL HIGH (ref 24–37)

## 2011-02-06 LAB — PHOSPHORUS
Phosphorus: 1.6 mg/dL — ABNORMAL LOW (ref 2.3–4.6)
Phosphorus: 2.5 mg/dL (ref 2.3–4.6)
Phosphorus: 2.6 mg/dL (ref 2.3–4.6)
Phosphorus: 2.7 mg/dL (ref 2.3–4.6)
Phosphorus: 2.7 mg/dL (ref 2.3–4.6)

## 2011-02-06 LAB — CREATININE, URINE, RANDOM
Creatinine, Urine: 34.5 mg/dL
Creatinine, Urine: 60.6 mg/dL

## 2011-02-06 LAB — IRON AND TIBC
Iron: 61 ug/dL (ref 42–135)
UIBC: 113 ug/dL

## 2011-02-06 LAB — POCT I-STAT 4, (NA,K, GLUC, HGB,HCT)
Glucose, Bld: 118 mg/dL — ABNORMAL HIGH (ref 70–99)
Potassium: 4.2 mEq/L (ref 3.5–5.1)

## 2011-02-06 LAB — CK: Total CK: 26 U/L (ref 7–177)

## 2011-02-06 LAB — SODIUM, URINE, RANDOM: Sodium, Ur: 85 mEq/L

## 2011-02-06 LAB — SEDIMENTATION RATE: Sed Rate: 72 mm/hr — ABNORMAL HIGH (ref 0–22)

## 2011-02-06 LAB — OSMOLALITY, URINE: Osmolality, Ur: 320 mOsm/kg — ABNORMAL LOW (ref 390–1090)

## 2011-02-06 LAB — ANAEROBIC CULTURE

## 2011-02-06 LAB — ARGININE VASOPRESSIN HORMONE: Arginine Vasopressin: 0.8 pg/mL (ref 0.0–4.7)

## 2011-02-06 LAB — URINE MICROSCOPIC-ADD ON

## 2011-02-06 LAB — FERRITIN: Ferritin: 281 ng/mL (ref 10–291)

## 2011-02-07 LAB — CBC
HCT: 22.1 % — ABNORMAL LOW (ref 36.0–46.0)
Hemoglobin: 7.5 g/dL — CL (ref 12.0–15.0)
MCHC: 33.8 g/dL (ref 30.0–36.0)
MCV: 87.1 fL (ref 78.0–100.0)
RBC: 2.54 MIL/uL — ABNORMAL LOW (ref 3.87–5.11)
WBC: 13.9 10*3/uL — ABNORMAL HIGH (ref 4.0–10.5)

## 2011-02-07 LAB — BASIC METABOLIC PANEL
CO2: 25 mEq/L (ref 19–32)
Chloride: 109 mEq/L (ref 96–112)
Creatinine, Ser: 1.32 mg/dL — ABNORMAL HIGH (ref 0.4–1.2)
GFR calc Af Amer: 50 mL/min — ABNORMAL LOW (ref >60)
Potassium: 3 mEq/L — ABNORMAL LOW (ref 3.5–5.1)
Sodium: 138 mEq/L (ref 135–145)

## 2011-03-07 NOTE — H&P (Signed)
NAMESHAM, ALVIAR               ACCOUNT NO.:  [id number]   MEDICAL RECORD NO.:  [id number]          PATIENT TYPE:  INP   LOCATION:  3103                         FACILITY:  [facility]   PHYSICIAN:  Danae Orleans. Venetia Maxon, M.D.  DATE OF BIRTH:  07-Dec-1945   DATE OF ADMISSION:  11/09/2008  DATE OF DISCHARGE:                              HISTORY & PHYSICAL   REASON FOR ADMISSION:  Rash and fever with confusion.   HISTORY OF ILLNESS:  Doris [name] is a 65 year old woman who underwent  a lumbar fusion in September 08, 2008, subsequently developed a wound  infection and underwent drainage of this on October 11, 2008.  She had  been managed with outpatient Vac dressing changes and was doing well.  However, yesterday developed beginnings of a rash which today is now  almost a total body rash very consistent with a drug reaction.  She has  a fever to 103.3 and she is also confused.  Her daughter brought her  into the office today and I examined her.  We also looked at her wound.  There does appear to be some purulence in the base of the wound.  She  has a fever to 103 which I believe is secondary to her very extensive  drug reaction rash.  I plan on admitting her to the hospital with IV  hydration and to step-down unit because of her significant confusion.  We will have Dr. [name] see her from Infectious Diseases.  Currently,  her heart rate is 118 and regular without murmurs, rubs, or gallops.  She has a temperature to 103.3.  Respiratory rate is 22.  Lungs are  clear in all fields.  She has no peripheral edema.  Active bowel sounds.  Last BM on November 08, 2008.  Vac dressing was changed and we will have  Wound Care Team assess her.  We will also get an MRI of her lumbar spine  with and without gadolinium to make sure she has no evidence of any  abscess or pus within the wound, but I think the likely cause of her  high fever relates to her rash.      Danae Orleans. Venetia Maxon, M.D.  Electronically  Signed     JDS/MEDQ  D:  11/09/2008  T:  11/09/2008  Job:  [id number]

## 2011-03-07 NOTE — Discharge Summary (Signed)
Doris [name], Doris [name]               ACCOUNT NO.:  [id number]   MEDICAL RECORD NO.:  [id number]          PATIENT TYPE:  INP   LOCATION:  3032                         FACILITY:  [facility]   PHYSICIAN:  [name], M.D.DATE OF BIRTH:  11/02/1945   DATE OF ADMISSION:  10/10/2008  DATE OF DISCHARGE:  10/20/2008                               DISCHARGE SUMMARY   DISCHARGE DIAGNOSIS:  1. Lumbar wound infection - status post wound incision and drainage      per Dr. Venetia Maxon on 12/20.  2. Status post lumbar fusion at L4-L5 September 08, 2008 by Dr. Venetia Maxon      for spondylolisthesis and stenosis with spondylosis and      radiculopathy at L4-L5.  3. Diarrhea - resolved, likely antibiotic associated, stool negative      for Clostridium difficile.  4. Hypokalemia - resolved.  5. Hyponatremia - resolved.  6. Hypertension.  7. Dyslipidemia.  8. History of low back pain.   PROCEDURES AND STUDIES:  1. Status post wound incision and drainage by Dr. Venetia Maxon on October 11, 2008.  2. Lumbar spine x-ray on September 08, 2008 - status post PLIF at L4-      L5.  3. CT scan of head - no acute intracranial findings.   CONSULTATIONS:  1. Neurosurgery, Dr. Venetia Maxon.  2. Infectious disease - Dr. [name].   BRIEF HISTORY:  The patient is a 65 year old white female with the above  listed medical problems who presented with complaints of fevers,  weakness, and body aches for 10-14 days.  She denied sore throat, cough,  and diarrhea.  She saw Dr. Venetia Maxon on the day prior to presentation and a  lumbosacral x-ray of her back was obtained and the results are stated  above.  On the night of presentation, her daughter took her to the [facility] the ER because of worsening of her symptoms with the high fevers  and she was admitted for further evaluation and management.   Please see the full admission history and physical for the details of  the admission physical exam, as well as the laboratory data.   HOSPITAL  COURSE:  1. Lumbar sacral wound infection - upon admission the patient was      empirically started on IV antibiotics.  A sed rate was done and it      was elevated at 117.  Neurosurgery was consulted because the      patient was complaining of worsening low back pain.  Vancomycin was      added to antibiotic regimen and Dr. Venetia Maxon saw the patient on 12/20      and following his evaluation, he noted that the patient had a      purulent drainage from incision and so she was taken to OR on 12/20      and the wound was incised and drained.  A specimen was sent from      this for cultures and it grew MRSA.  Infectious disease was      consulted for further recommendations and Dr. [name] saw  the      patient and recommended that she stay on IV vancomycin for a total      of 6 weeks.  Rifampin was also added to her medication regimen.      Following the findings that she had at drainage from the incision      in her back, the other empiric antibiotics for possible pneumonia      were discontinued.  She defervesced on the regimen of the      vancomycin and Rifampin and has remained hemodynamically stable.      The patient has slowly improved.  Physical therapy was consulted      and followed the patient while in the hospital.  Arrangements for      home health have been made and she will be discharged home on IV      vancomycin and the Rifampin.  She is to have a vancomycin trough      done by home health in 2-3 days.  She is to follow up with Dr.      Venetia Maxon as scheduled and with Dr. [name] as needed.  2. Diarrhea - the patient developed diarrhea on 12/27 and it was noted      that she had been on antibiotics.  She was then started on      Florastor and stools were sent for C diff.  She has had two      specimens negative for C diff.  Following the negative specimen,      she was given a dose of Imodium and she has not had any further      symptoms.  She has been instructed to come to continue  the      Florastor while she is on the antibiotics.  She is to follow up      with her primary care physician.  3. Hyponatremia - secondary to number two.  She was hydrated with      normal saline and this has resolved.  Her recheck sodium today is      138.  4. Hypokalemia - as potassium was replaced, her last potassium on      recheck today prior to discharge is 3.7.  5. Hypertension - the patient is to continue her outpatient      medications upon discharge.  6. History of hyperlipidemia - she is to continue her Vytorin upon      discharge.   DISCHARGE MEDICATIONS:  1. Vancomycin 1500 mg IV q.12h. for a total of 6 weeks.  2. Rifampin 300 mg p.o. b.i.d. times 6 weeks.  3. Percocet 5 mg p.o. q.4-6h. p.r.n.  4. Protonix 40 mg p.o. daily.  5. Florastor 250 mg p.o. b.i.d.  6. Imodium p.r.n. as directed.  7. The patient is to continue Vytorin 10/40 mg daily.  8. Maxzide p.o. daily.  9. Aspirin 81 mg p.o. daily.  10.Klor-Con 20 mEq p.o. daily.  11.Benazepril 20 mg p.o. daily.  12.Lyrica 75 mg p.o. b.i.d.   The Darvocet and diazepam were discontinued.   FOLLOW-UP CARE:  1. Dr. Venetia Maxon as scheduled.  2. Primary care physician at the [facility] in 1-2      weeks, call for appointment.   DISCHARGE CONDITION:  Improved/stable.      [name], M.D.  Electronically Signed     ACV/MEDQ  D:  10/20/2008  T:  10/20/2008  Job:  [id number]   cc:   [facility]  Danae Orleans. Venetia Maxon, M.D.

## 2011-03-07 NOTE — Op Note (Signed)
Doris [name], Doris [name]               ACCOUNT NO.:  [id number]   MEDICAL RECORD NO.:  [id number]          PATIENT TYPE:  INP   LOCATION:  3103                         FACILITY:  [facility]   PHYSICIAN:  Danae Orleans. Venetia Maxon, M.D.  DATE OF BIRTH:  1946/03/08   DATE OF PROCEDURE:  11/13/2008  DATE OF DISCHARGE:                               OPERATIVE REPORT   PREOPERATIVE DIAGNOSIS:  Lumbar wound infection with sacral epidural  abscess.   POSTOPERATIVE DIAGNOSIS:  Lumbar wound infection.   PROCEDURE:  Lumbar wound debridement with sacral 1 through 2 laminectomy  with microdissection.   SURGEON:  Danae Orleans. Venetia Maxon, MD   ASSISTANT:  Hewitt Shorts, MD   ANESTHESIA:  General endotracheal anesthesia.   ESTIMATED BLOOD LOSS:  Minimal.   COMPLICATIONS:  None.   DISPOSITION:  Recovery.   INDICATIONS:  Doris [name] is a 65 year old woman who has had undergone  lumbar fusion at L4-5.  She developed postoperative wound infection and  with this required wound debridement with back dressing.  She then  developed a severe skin reaction to rifampin and vancomycin, which  required hospitalization and pressors with intensive care stay.  An MRI  of her lumbar spine was obtained, which showed what appeared to be a  large sacral abscess in the epidural space as well as fluid around the  previously implanted hardware.  It was elected when the patient was  stabilized medically to take her to surgery to explore her wounds, to be  debrided, and to perform laminectomy and drainage of sacral epidural  abscess.   DESCRIPTION OF PROCEDURE:  Ms. Iglesia was brought to the operating  room.  Following satisfactory and uncomplicated induction of general  endotracheal anesthesia plus intravenous lines, the patient was turned  to the prone position on the chest rolls.  Her low back was prepped and  draped with Betadine scrub and paint.  Her previous wound dressing was  removed.  This actually looked quite good and  was healing.  There did  not appear to be any signs of significant ongoing infection.  The wound  was reopened, and the fascia was reopened and carried to the previously  implanted hardware and there did seem to be some granulation tissue  around the hardware.  There did not appear to be any pockets of pus.  Cultures were taken and cultures came back as some white blood cells  without organisms.  The wound was extensively irrigated with a liter of  bacitracin saline with Pulsavac and then followed by a liter of normal  saline.  It was then elected to reapproximate the fascia, which was done  loosely with 1-0 PDS.  Because of the appearance of a large epidural  abscess in the sacral region, it was elected to extend the incision  caudad overlying the L5-S1 interspace.  This was carried on the right  side of midline, which appeared to be the area of a denser fluid  collection.  The subperiosteal dissection was performed exposing the L5-  S1 interspace.  Microscope was brought into field and a S1-S2  laminectomy was  performed with a high-speed drill and completed with  Kerrison rongeurs revealing what appeared to be ballottable dura without  evidence of an epidural abscess.  No margin was seen along the edge of  the dura.  There was no evidence of any pus.  There was some epidural  fat, but there does not appear to be purulent material.  It was elected  to not to open the dura out of concern that this might spread the  infection intradurally with severe effects to the patient.  We called  for ultrasound to be able to assess whether there was a separate  septation, and we were told ultrasound was not available.  At this  point, we, therefore, elected to close the new wound, and this was done  with 1 PDS reapproximating the fascia and a couple of 2-0 Vicryl sutures  were placed and then 3-0 nylon interrupted sutures were placed  reapproximating the skin edges.  The vac dressing was then applied  to  the previous lumbar wound.  The patient was extubated in the operating  room and taken to the recovery room in stable satisfactory condition  having tolerated the operation well.  Counts were correct at the end of  the case.      Danae Orleans. Venetia Maxon, M.D.  Electronically Signed     JDS/MEDQ  D:  11/13/2008  T:  11/14/2008  Job:  [id number]

## 2011-03-07 NOTE — H&P (Signed)
Doris [name]               ACCOUNT NO.:  [id number]   MEDICAL RECORD NO.:  [id number]          PATIENT TYPE:  INP   LOCATION:  1504                         [facility]   PHYSICIAN:  Doris [name], MDDATE OF BIRTH:  Nov 06, 1945   DATE OF ADMISSION:  10/09/2008  DATE OF DISCHARGE:                              HISTORY & PHYSICAL   CHIEF COMPLAINT:  Fevers, sweats, weight loss.   HISTORY OF PRESENT ILLNESS:  The patient is a 65 year old female who has  been sick for 10-14 days with flu-like symptoms, namely severe body  aches, sweats, headache, high fever, weakness.  Of note, she denies sore  throat, cough, diarrhea.  She saw Dr. Venetia Maxon yesterday for postop  checkup.  He checked her wound on the lumbar sacral area and obtained an  x-ray of her lumbar sacral spine.  She was reassured.  Tonight her  daughter took her to the emergency room in [facility] due to worsening  of her symptoms and high fever.   PAST MEDICAL HISTORY:  Hypertension, dyslipidemia, low back pain.   PAST SURGICAL HISTORY:  L4-5 fusion on September 08, 2008, by Dr. Venetia Maxon.   CURRENT MEDICATIONS:  1. Vytorin 10/40 daily.  2. Darvocet p.r.n.  3. Triamterene/HCTZ 37.5 once daily.  4. Oral aspirin.  5. Oxycodone/acetaminophen 5/325 p.r.n.  6. Klor-Con 20 one daily.  7. Benazepril 20 mg daily.  8. Lyrica 75 mg twice daily.  9. Diazepam 5 mg every 8 hours.   ALLERGIES:  CODEINE.   SOCIAL HISTORY:  Does not smoke or drink.   FAMILY HISTORY:  Positive for hypertension.   REVIEW OF SYSTEMS:  Weight loss noted, bedridden for the most part over  past 2 weeks, bad headaches.  No confusion, no syncope.  The rest of the  18 point review of systems is as above or negative.   PHYSICAL EXAMINATION:  VITAL SIGNS:  Temperature 102.3, heart rate 102,  respirations 21, blood pressure 125/78.  GENERAL:  She is in no acute distress.  Appears tired.  HEENT:  Without drainage.  Throat with slight erythema.  NECK:  Supple.  No meningeal signs.  LUNGS:  Clear with decreased breath sounds at bases.  No wheezes.  HEART:  S1, S2.  No murmur.  No gallop.  Tachycardic.  Grade 1-2/6  systolic murmur.  ABDOMEN:  Soft, nontender and no organomegaly or mass felt.  LOWER EXTREMITIES:  Without edema.  Calves nontender.  NEUROLOGICAL:  She is alert, oriented, cooperative.  Denies being  depressed.  Peripheral pulses normal.  Deep tendon reflexes and muscle  strength within normal limits.  SKIN:  Clear without rashes or hemorrhagic lesions.   LABS:  White count 22.4 thousand, hemoglobin and platelet counts normal.  Urinalysis normal.  Glucose 133, otherwise BMET is normal.  Chest x-ray  with retrocardiac infiltrate.   ASSESSMENT AND PLAN:  1. Febrile illness of unclear etiology.  H1N1 influenza is definitely      an option, however, she is lacking respiratory symptoms and almost      lacking gastrointestinal symptoms.  Nevertheless, we will start her  on empiric Tamiflu.  She was given a dose of Zosyn in the emergency      room.  I will start her on Zithromax and Rocephin IV.  Obtain sed      rate, repeat blood culture, may need a urine culture.  In view of      her heart murmur would like to obtain an echocardiogram.  2. Abnormal chest x-ray with retrocardiac possible infiltrate.  Will      cover with antibiotics.  Of note, respiratory symptoms are absent.  3. Sweats.  Plan as above.  4. Myalgias.  Will hold statin for now.  5. Status post L4-5 fusion with normal postop checkup by Dr. Venetia Maxon      yesterday with a normal LS spine x-ray.  6. Hypertension.  Continue current therapy.  Will hold an ACE      inhibitor for now.      Doris Quint. Plotnikov, MD  Electronically Signed     AVP/MEDQ  D:  10/09/2008  T:  10/10/2008  Job:  [id number]   cc:   Danae Orleans. Venetia Maxon, M.D.  Fax: [phone]   Thereasa Solo. Little, M.D.  Fax: [phone]

## 2011-03-07 NOTE — Op Note (Signed)
[name], [name]               ACCOUNT NO.:  [id number]   MEDICAL RECORD NO.:  [id number]          PATIENT TYPE:  INP   LOCATION:  3535                         FACILITY:  [facility]   PHYSICIAN:  Danae Orleans. Venetia Maxon, M.D.  DATE OF BIRTH:  [birth year]   DATE OF PROCEDURE:  12/24/2008  DATE OF DISCHARGE:                               OPERATIVE REPORT   PREOPERATIVE DIAGNOSIS:  Lumbar wound infection.   POSTOPERATIVE DIAGNOSIS:  Lumbar wound infection.   PROCEDURE:  Lumbar wound revision.   SURGEON:  Danae Orleans. Venetia Maxon, MD   ANESTHESIA:  General endotracheal anesthesia.   ESTIMATED BLOOD LOSS:  Minimal.   COMPLICATIONS:  None.   DISPOSITION:  Recovery.   INDICATIONS:  Doris [name] is a 65 year old woman who had previously  undergone lumbar fusion and then developed a wound infection requiring  exploration and debridement.  She later had a VAC dressing which had  helped considerably with the closure through granulation of the wound,  however, there was a small area of residual wound left and it was  elected to take her back to surgery to close this primarily.   PROCEDURE IN DETAIL:  Doris [name] was brought to the operating room.  Her VAC dressing was removed.  Her back was prepped and draped with  Betadine scrub and paint.  Wound was washed out.  Then the remaining  wound was ellipsed out down to a clean tissue.  The wound was  extensively irrigated with bacitracin and saline, and closed with 3-0  PDS sutures in subcutaneous tissues and then running 3-0 nylon lock  stitches reapproximating the skin edges.  The wound was dressed with  bacitracin, Telfa, gauze, and tape.  The patient was extubated in the  operating table and taken to recovery room in stable and satisfactory  having tolerated the operation well.  Counts were correct at the end of  the case.      Danae Orleans. Venetia Maxon, M.D.  Electronically Signed     JDS/MEDQ  D:  12/24/2008  T:  12/25/2008  Job:  [id number]

## 2011-03-07 NOTE — Op Note (Signed)
NAMEALIRA, FRETWELL               ACCOUNT NO.:  [id number]   MEDICAL RECORD NO.:  [id number]          PATIENT TYPE:  INP   LOCATION:  3032                         FACILITY:  [facility]   PHYSICIAN:  Danae Orleans. Venetia Maxon, M.D.  DATE OF BIRTH:  1946/06/02   DATE OF PROCEDURE:  10/11/2008  DATE OF DISCHARGE:                               OPERATIVE REPORT   PREOPERATIVE DIAGNOSIS:  Wound infection.   POSTOPERATIVE DIAGNOSIS:  Wound incision and drainage.   SURGEON:  Danae Orleans. Venetia Maxon, MD   ANESTHESIA:  General endotracheal anesthesia.   ESTIMATED BLOOD LOSS:  Minimal.   COMPLICATIONS:  None.   DISPOSITION:  Recovery.   INDICATIONS:  [name] is a 65 year old woman who 5 weeks ago  underwent uncomplicated lumbar fusion at L4-L5.  Couple of weeks ago,  she developed infection what appeared to be H1N1 and then a few days ago  developed some fluid collection over her low back without a significant  tenderness.  Over the last couple of days, she started developing  increasing back pain and headaches and then today developed drainage  from her wound.  It was therefore elected to take her to surgery for  wound exploration and I and D.  Initially, an MRI had been ordered to  try to ascertain whether there were signs of wound infection, however,  when the purulent drainage became apparent and due to significant delay  in getting the MRI, we elected to take her straight to Surgery for I and  D of her lumbar wound.   PROCEDURE IN DETAIL:  [name] was brought to the operating room.  She was placed in a prone position on flat rolls after smooth and  uncomplicated induction of general endotracheal anesthesia.  There was a  small amount of purulent drainage from the upper aspect of her lumbar  incision.  After prepping and draping in the usual fashion, the incision  was opened and this lead to the expression of a significant amount of  purulent material.  Multiple cultures were obtained  including aerobic  and anaerobic cultures swabs x2.  She had already been started on  vancomycin and Rocephin and Zithromax.  The wound was then further  opened and this led to expression of a more purulent material.  It  appeared that the fascia was intact, however, at the lower aspect of the  fascial incision there did appear to be dehiscence and this was then  opened exposing the hardware and there is was purulent material all the  way down to the previous spinal surgery.  The Pulsavac was then used  with 3 L of saline to wash out the wound and any loose material was  removed including previously placed suture material.  The wound was also  irrigated with bacitracin and saline.  After irrigation, it was felt  that the wound had been sufficiently debrided.  The fascia was loosely  reapproximated with 0 Vicryl sutures with three 0 Vicryl sutures and  then a small VAC dressing sponge was  placed and the VAC dressing was hooked up in the usual fashion.  There  appeared to be a good seal and the patient was then turned in supine  position, extubated, and taken to recovery in stable and satisfactory  condition having tolerated the operation well.      Danae Orleans. Venetia Maxon, M.D.  Electronically Signed     JDS/MEDQ  D:  10/11/2008  T:  10/11/2008  Job:  [id number]

## 2011-03-07 NOTE — Op Note (Signed)
Doris [name], Doris [name]               ACCOUNT NO.:  [id number]   MEDICAL RECORD NO.:  [id number]          PATIENT TYPE:  INP   LOCATION:  3032                         FACILITY:  [facility]   PHYSICIAN:  Danae Orleans. Venetia Maxon, M.D.  DATE OF BIRTH:  1946-04-25   DATE OF PROCEDURE:  09/08/2008  DATE OF DISCHARGE:                               OPERATIVE REPORT   PREOPERATIVE DIAGNOSIS:  Spondylolisthesis and stenosis with spondylosis  and radiculopathy at L4-5.   POSTOPERATIVE DIAGNOSIS:  Spondylolisthesis and stenosis with  spondylosis and radiculopathy at L4-5.   PROCEDURES:  1. L4-5 decompression with laminectomy and extensive decompression of      the L4 and L5 nerve roots.  2. Transforaminal lumbar interbody fusion at L4-5.  3. Nonsegmental instrumentation with pedicle screw fixation L4 through      L5 levels.  4. Posterolateral arthrodesis L4 through L5 levels with autograft,      BMP, and EquivaBone.   SURGEONS:  Danae Orleans. Venetia Maxon, MD   ASSISTANTS:  1. Georgiann Cocker, RN  2. Hilda Lias, MD   ANESTHESIA:  General endotracheal anesthesia.   ESTIMATED BLOOD LOSS:  250 mL   COMPLICATIONS:  None.   DISPOSITION:  Recovery.   INDICATION:  Doris [name] is a 65 year old woman with severe left leg  pain.  She has a grade 1-2 mobile spondylolisthesis of L4 and L5 with  severe facet arthropathy and significant left L4 and L5 nerve root  compression.  It was elected to take her to surgery for decompression  and fixation at this level.   PROCEDURE:  [name] was brought to the operating room.  Following  satisfactory and uncomplicated induction of general endotracheal  anesthesia and placement of intravenous lines and Foley catheter, she  was placed in a prone position on the [place] table.  Soft tissue and  bony prominences were padded appropriately.  Her low back was prepped  and draped in the usual sterile fashion.  Area of planned incision was  infiltrated with 0.25% Marcaine and  0.5% lidocaine with 1:200,000  epinephrine.  Incision was made in the midline carried through to the  lumbodorsal fascia.  It was incised bilaterally exposing the L4 and L5  level and transverse processes of L4 and L5 bilaterally.  Self-retaining  Versa-Trac retractor was placed.  Intraoperative x-ray with marker  probes, the L4 and L5 transverse processes confirmed correct level.  Subsequently, hemilaminectomy of L4 was performed with high-speed drill  and completed with Kerrison rongeurs and markedly hypertrophied, bony  and ligamentous tissue was very carefully removed overlying the L4 and  L5 nerve roots which were extensively decompressed.  Decompression was  greater than would be typical for a TLIF fusion.  Subsequently, the  interspace at L4-5 was incised and disk material was removed in  piecemeal fashion.  Endplates were stripped of residual disk material  and medial and lateral extent of the interspace was also decompressed.  The laminar spreader was placed on the right at the L4-5 level and after  trial sizing, an 8 mm TLIF banana-shaped cage was selected, packed with  extra small  BMP and EquivaBone.  Additional piece of BMP was placed deep  within the interspace along with additional EquivaBone.  The cage was  placed and tamped into position appropriately.  Additional EquivaBone  and bone autograft were then placed overlying the cage.  Pedicle screws  were then placed at L4 and L5 using 40 x 5.5 mm screws at L5 and 45 x  5.5 mm screws at L4.  The right L4 screw was redirected after  positioning was seen to be somewhat lateral on AP fluoroscopic image and  final images with fluoroscopy demonstrated well-positioned pedicle  screws at L4 and L5 bilaterally.  A 40-mm rods were placed overlying the  screws and they were locked down in situ.  Additional bone autograft was  placed in the posterolateral region on the right along with the  remaining EquivaBone.  The wound was then  irrigated.  Soft tissues were  inspected including nerve roots and found to be without any good repair.  The lumbodorsal fascia was closed with 1 Vicryl sutures, subcutaneous  tissue was reapproximated with 2-0 Vicryl and interrupted sutures and  skin edges were approximated with interrupted 3-0 Vicryl subcu stitch.  The wound was dressed with Benzoin, Steri-Strips, Telfa gauze, and tape.  The patient was extubated in the operating room and taken to recovery in  stable and satisfactory condition having tolerated the operation well.  All sponge and needle counts were correct at the end of the case.      Danae Orleans. Venetia Maxon, M.D.  Electronically Signed     JDS/MEDQ  D:  09/08/2008  T:  09/08/2008  Job:  [id number]

## 2011-03-07 NOTE — Discharge Summary (Signed)
Doris [name], Doris [name]               ACCOUNT NO.:  [id number]   MEDICAL RECORD NO.:  [id number]          PATIENT TYPE:  INP   LOCATION:  3032                         FACILITY:  [facility]   PHYSICIAN:  Danae Orleans. Venetia Maxon, M.D.  DATE OF BIRTH:  [birth year]   DATE OF ADMISSION:  09/08/2008  DATE OF DISCHARGE:  09/12/2008                               DISCHARGE SUMMARY   REASON FOR ADMISSION:  1. Lumbosacral spondylosis.  2. Spondylolisthesis.  3. Hypertension.  4. Lumbar radiculopathy.   FINAL DIAGNOSES:  1. Lumbosacral spondylosis.  2. Spondylolisthesis.  3. Hypertension.  4. Lumbar radiculopathy.   HISTORY OF ILLNESS AND HOSPITAL COURSE:  Doris [name] is a 65 year old  woman with lumbar stenosis, spondylosis, and spondylolisthesis at L4-L5.  It was elected to take her to Surgery for this lumbar decompression and  fusion at the L4-5 level.  She tolerated the procedure well, was  gradually mobilized, doing well on September 11, 2008, and had been  working with physical therapy, was discharged home on the morning of  September 12, 2008, with discharge medications of Percocet and Valium and  instructions to follow up in the office in 3 weeks.      Danae Orleans. Venetia Maxon, M.D.  Electronically Signed     JDS/MEDQ  D:  10/30/2008  T:  10/31/2008  Job:  [id number]

## 2011-03-10 NOTE — Op Note (Signed)
Doris [name]                          ACCOUNT NO.:  [id number]   MEDICAL RECORD NO.:  [id number]                   PATIENT TYPE:  OIB   LOCATION:  3014                                 FACILITY:  [facility]   PHYSICIAN:  Danae Orleans. Venetia Maxon, M.D.               DATE OF BIRTH:  10/26/1945   DATE OF PROCEDURE:  09/12/2002  DATE OF DISCHARGE:                                 OPERATIVE REPORT   PREOPERATIVE DIAGNOSES:  Herniated cervical disk with spondylosis,  degenerative disk disease, stenosis, with myelopathy at the C4-5, C5-6 and  C6-7 levels.   POSTOPERATIVE DIAGNOSES:  Herniated cervical disk with spondylosis,  degenerative disk disease, stenosis, with myelopathy at C4-5, C5-6 and C6-7  levels.   PROCEDURE:  Anterior cervical decompression and fusion at the C4-5, C5-6 and  C6-7 levels with allograft bone graft and anterior cervical plating.   SURGEON:  Danae Orleans. Venetia Maxon, M.D.   ANESTHESIA:  General endotracheal anesthesia.   ESTIMATED BLOOD LOSS:  Minimal.   COMPLICATIONS:  None.   DISPOSITION:  To the recovery room.   INDICATIONS FOR PROCEDURE:  The patient is a 65 year old woman with profound  right deltoid weakness and severe right upper extremity pain with myelopathy  and cervical radiculopathy, with cervical spondylosis, stenosis, and  cervical disk herniation at the C4-5, C5-6 and C6-7 levels.  It was elected  to take her to surgery for an anterior cervical diskectomy, decompression  and fusion at these effected levels.   DESCRIPTION OF PROCEDURE:  The patient was brought to the operating room.  Following the satisfaction and uncomplicated induction of general  endotracheal anesthesia __________ , the patient was placed in the supine  position on the operating room table.  Her neck was placed in slight  extension.  She was placed in 10 pounds of full traction.  Her anterior neck  was then prepped and draped in the usual sterile fashion.  The area of  planned  incision was infiltrated with 0.25% Marcaine and 0.5% lidocaine and  1:200,000 epinephrine.  An incision was made from the midline to the  anterior border of the sternocleidomastoid muscle and carried sharply  through the platysmal layers.  A subplatysmal dissection was performed  exposing the anterior border of the sternocleidomastoid muscle.  Blunt  dissection was performed through the carotid sheath lateral, carried to the  esophagus medial, exposing the anterior cervical spine.  A spinal needle was  placed in what was felt to be the C6 level, and this was confirmed on the  intraoperative x-ray.  Subsequently blunt dissection was performed using a  Kitner elevator to expose the C4-5 through C6-7 levels.  The longus coli  muscles were taken down with electrocautery and key elevator.  Self-  retaining __________  retractor was placed along with cut-down retractor to  facilitate exposure to each of the levels.  The ventral osteophytes were  removed with the [employer]  rongeur and the interspaces at C4-5, C5-6 and C6-7  were incised with the #15 blade and disk material was removed in a piece-  meal fashion with pituitary rongeurs.  A further diskectomy was performed  using Karlin curets, and was subsequently starting at the C4-5 level a disk  space spreader was placed and the microscope was brought into the field  using micro-iliac dissection technique.  The end plates of C4 and C5 were  decorticated with the Anspach drill and the A2 equivalent bur, and disk  material was removed along with the posterior longitudinal ligament which  was incised with an arachnoid knife.  The C5 nerve root was widely  decompressed on the right.  This was the most effected level, and there  appeared to be disk herniation, as well as spondylitic material compressing  the right C5 nerve root.  Decompression was then performed to the left as  well, and this nerve root did not appear to be nearly as severely   compressed.  A 7.0 mm patellar allograft wedge was then inserted in the  interspace and counter-sunk appropriately.   Attention was then turned to the C5-6 level where a similar decompression  was performed, and uncinate spurs were drilled down the C6 nerve root with a  decompressive widely, which extended up the neural foramen bilaterally.  Hemostasis was assured with Gelfoam soaked in thrombin.  The 7.0 mm bone  graft was inserted at this level as well.  Subsequently at the C6-7 level a  similar decompression was performed.  The C7 nerve roots and the common  dural tube were decompressed widely.  Hemostasis was again assured and an  8.0 mm patellar allograft wedge was inserted and this interspace __________  appropriately.  Subsequently, a 63.0 mm __________ anterior cervical plate  was affixed to the anterior cervical spine with two 12.0 mm variable angled  screws at C4, two at C5, two at C6, two at C7.  Locking mechanisms were  engaged.  All screws had excellent purchase.  The wound was then irrigated.  The soft tissues were inspected and found to be in good repair.  Hemostasis  was assured.  The final x-ray confirmed __________ of bone graft in the  anterior cervical plate.  The platysmal layer was then closed with #3-0  Vicryl sutures and the skin edges were approximated with running #4-0 Vicryl  subcuticular stitch.  The wound was dressed with Dermabond.   The patient was extubated in the operating room and taken to the recovery  room in stable and satisfactory condition, having tolerated the operation  well.  Counts were correct at the end of the case.                                                Danae Orleans. Venetia Maxon, M.D.    JDS/MEDQ  D:  09/12/2002  T:  09/13/2002  Job:  [id number]

## 2011-03-10 NOTE — Discharge Summary (Signed)
NAMEANAMARIA, DUSENBURY               ACCOUNT NO.:  [id number]   MEDICAL RECORD NO.:  [id number]          PATIENT TYPE:  INP   LOCATION:  3001                         FACILITY:  [facility]   PHYSICIAN:  Danae Orleans. Venetia Maxon, M.D.  DATE OF BIRTH:  1945-10-31   DATE OF ADMISSION:  11/09/2008  DATE OF DISCHARGE:  11/23/2008                               DISCHARGE SUMMARY   REASON FOR ADMISSION:  Admission for septicemia with septic shock, acute  renal failure, drug dermatitis, adverse effect of antibiotic, and  adverse effect of mycobacterial antibiotic, hypo-osmolality, acidosis,  posthemorrhagic anemia, hypertension, and hyperlipidemia.   HISTORY OF ILLNESS AND HOSPITAL COURSE:  Doris [name] is a 65 year old  woman who was admitted to the hospital from my office with acute drug  rash and fever.  This was felt to be secondary to vancomycin and  rifampin.  She had a temperature of 103.3.  She was seen in conjunction  with Critical Care Service.  She had a white blood count of 13,  creatinine of 1.59, and sodium of 130.  PT and PTT were both elevated.  She denied any lower extremity pain.  She had been previously treated  with VAC dressing at home.  This was for MRSA postoperative wound  infection.  The patient was started on Cubicin.  She was seen by  Dermatology and Infectious Diseases and also Critical Care Medicine.  The rash was not felt to be secondary to the infection, but rather the  result of the antibiotic and fever and the patient had low blood  pressure on the 20th, which improved with hydration.  The patient  gradually had improvement in her septicemia and was taken back to  surgery for what appeared to be imaging studies of sacral epidural  abscess.  This was done on November 13, 2008.  She had sacral 1 and 2  laminectomy with microdissection.  She had wound debridement.  She did  not appear to be any frank purulent material.  She gradually improved  both in terms of her renal function,  septic shock, and rash and was  gradually doing better on the 26th at which point she was transferred to  the Inpatient Floor Unit.  The patient had a hemoglobin of 7.6 and white  blood count remained elevated at 21.8.  The patient was continued with  observation and gradually improved.  White blood count was dropped to  13,000 on November 23, 2008, and hematocrit was 22.  No evidence of  hypertension  or tachycardia at this point and she was discharged home  with followup in the office in 10 days.  She was on prednisone for drug  rash and given oxycodone and Benadryl along with Pepcid and had the VAC  dressing at home.  Follow up with Dr. [name] as an outpatient on  December 01, 2008, with CBC and BMP.      Danae Orleans. Venetia Maxon, M.D.  Electronically Signed     JDS/MEDQ  D:  01/28/2009  T:  01/29/2009  Job:  [id number]

## 2011-03-10 NOTE — Op Note (Signed)
NAMERoney [name]                          ACCOUNT NO.:  [id number]   MEDICAL RECORD NO.:  [id number]                   PATIENT TYPE:  AMB   LOCATION:  ENDO                                 FACILITY:  [facility]   PHYSICIAN:  Bernette Redbird, M.D.                DATE OF BIRTH:  Apr 25, 1946   DATE OF PROCEDURE:  10/07/2003  DATE OF DISCHARGE:                                 OPERATIVE REPORT   PROCEDURE PERFORMED:  Colonoscopy.   ENDOSCOPIST:  Florencia Reasons, M.D.   INDICATIONS FOR PROCEDURE:  Screening for colon cancer in an asymptomatic 24-  year-old female with family history of colon polyps in her half sister  (indeterminate histology).   DESCRIPTION OF PROCEDURE:  The nature, purpose and risks of the procedure  had been discussed with the patient, who provided written consent.  Sedation  was fentanyl 100 mcg and Versed 8 mg IV without arrhythmias or desaturation.   The Olympus adjustable tension pediatric video colonoscope was advanced to  the terminal ileum without much difficulty, using a little bit of external  abdominal compressing and turning the patient into the supine position.  The  terminal ileum had a normal appearance and pullback was then performed.  The  quality of the prep was excellent and it is felt that all areas were well  seen.  In the sigmoid region were several small mouthed diverticula.  No polyps,  cancer, colitis or vascular malformations were observed anywhere in the  colon.  Retroflexion in the rectum and reinspection of the rectosigmoid was  unremarkable.  No biopsies were obtained.  The patient tolerated the  procedure well and there were no apparent complications.   IMPRESSION:  1. Family history of colon polyps (V19.8).  2. Minimal diverticulosis.  3. Otherwise unremarkable exam.   PLAN:  Follow-up colonoscopy in five years in view of the family history of  colon polyps.                                               Bernette Redbird,  M.D.    RB/MEDQ  D:  10/07/2003  T:  10/07/2003  Job:  [id number]   cc:   Hal Morales, M.D.  Fax: [phone]

## 2011-05-24 HISTORY — PX: CARDIOVASCULAR STRESS TEST: SHX262

## 2011-06-21 ENCOUNTER — Ambulatory Visit (HOSPITAL_COMMUNITY)
Admission: RE | Admit: 2011-06-21 | Discharge: 2011-06-21 | Disposition: A | Discharge status: Home / Self Care | Payer: 59 | Source: Ambulatory Visit | Attending: Orthopedic Surgery | Admitting: Orthopedic Surgery

## 2011-06-21 ENCOUNTER — Other Ambulatory Visit: Payer: Self-pay | Admitting: Orthopedic Surgery

## 2011-06-21 ENCOUNTER — Encounter (HOSPITAL_COMMUNITY): Payer: 59

## 2011-06-21 ENCOUNTER — Other Ambulatory Visit (HOSPITAL_COMMUNITY): Payer: Self-pay | Admitting: Orthopedic Surgery

## 2011-06-21 DIAGNOSIS — M169 Osteoarthritis of hip, unspecified: Secondary | ICD-10-CM | POA: Insufficient documentation

## 2011-06-21 DIAGNOSIS — Z01811 Encounter for preprocedural respiratory examination: Secondary | ICD-10-CM | POA: Insufficient documentation

## 2011-06-21 DIAGNOSIS — Z01812 Encounter for preprocedural laboratory examination: Secondary | ICD-10-CM | POA: Insufficient documentation

## 2011-06-21 DIAGNOSIS — Z79899 Other long term (current) drug therapy: Secondary | ICD-10-CM | POA: Insufficient documentation

## 2011-06-21 DIAGNOSIS — I10 Essential (primary) hypertension: Secondary | ICD-10-CM | POA: Insufficient documentation

## 2011-06-21 DIAGNOSIS — M1610 Unilateral primary osteoarthritis, unspecified hip: Secondary | ICD-10-CM | POA: Insufficient documentation

## 2011-06-21 LAB — DIFFERENTIAL
Eosinophils Relative: 5 % (ref 0–5)
Lymphocytes Relative: 32 % (ref 12–46)
Lymphs Abs: 3.2 10*3/uL (ref 0.7–4.0)
Monocytes Absolute: 0.8 10*3/uL (ref 0.1–1.0)
Monocytes Relative: 8 % (ref 3–12)
Neutro Abs: 5.5 10*3/uL (ref 1.7–7.7)

## 2011-06-21 LAB — CBC
HCT: 43.7 % (ref 36.0–46.0)
Hemoglobin: 14.6 g/dL (ref 12.0–15.0)
MCHC: 33.4 g/dL (ref 30.0–36.0)
MCV: 88.3 fL (ref 78.0–100.0)
RDW: 13.7 % (ref 11.5–15.5)

## 2011-06-21 LAB — BASIC METABOLIC PANEL
BUN: 16 mg/dL (ref 6–23)
Calcium: 12 mg/dL — ABNORMAL HIGH (ref 8.4–10.5)
GFR calc Af Amer: >60 mL/min (ref >60)
GFR calc non Af Amer: >60 mL/min (ref >60)
Glucose, Bld: 75 mg/dL (ref 70–99)
Potassium: 3.2 mEq/L — ABNORMAL LOW (ref 3.5–5.1)
Sodium: 139 mEq/L (ref 135–145)

## 2011-06-21 LAB — URINALYSIS, ROUTINE W REFLEX MICROSCOPIC
Bilirubin Urine: NEGATIVE
Hgb urine dipstick: NEGATIVE
Nitrite: NEGATIVE
Specific Gravity, Urine: 1.009 (ref 1.005–1.030)
Urobilinogen, UA: 0.2 mg/dL (ref 0.0–1.0)
pH: 6 (ref 5.0–8.0)

## 2011-06-21 LAB — URINE MICROSCOPIC-ADD ON

## 2011-06-21 LAB — PROTIME-INR: INR: 1.04 (ref 0.00–1.49)

## 2011-06-21 LAB — SURGICAL PCR SCREEN: Staphylococcus aureus: NEGATIVE

## 2011-06-22 ENCOUNTER — Other Ambulatory Visit (HOSPITAL_COMMUNITY): Payer: Self-pay | Admitting: Orthopedic Surgery

## 2011-06-23 ENCOUNTER — Other Ambulatory Visit (HOSPITAL_COMMUNITY): Payer: Self-pay | Admitting: Orthopedic Surgery

## 2011-06-23 ENCOUNTER — Ambulatory Visit (HOSPITAL_COMMUNITY)
Admission: RE | Admit: 2011-06-23 | Discharge: 2011-06-23 | Disposition: A | Discharge status: Home / Self Care | Payer: 59 | Source: Ambulatory Visit | Attending: Orthopedic Surgery | Admitting: Orthopedic Surgery

## 2011-06-23 DIAGNOSIS — R229 Localized swelling, mass and lump, unspecified: Secondary | ICD-10-CM | POA: Insufficient documentation

## 2011-06-23 DIAGNOSIS — M5124 Other intervertebral disc displacement, thoracic region: Secondary | ICD-10-CM | POA: Insufficient documentation

## 2011-06-23 DIAGNOSIS — D1779 Benign lipomatous neoplasm of other sites: Secondary | ICD-10-CM | POA: Insufficient documentation

## 2011-07-04 ENCOUNTER — Inpatient Hospital Stay (HOSPITAL_COMMUNITY)
Admission: RE | Admit: 2011-07-04 | Discharge: 2011-07-07 | DRG: 470 | Disposition: A | Discharge status: Home Health | Payer: 59 | Source: Ambulatory Visit | Attending: Orthopedic Surgery | Admitting: Orthopedic Surgery

## 2011-07-04 ENCOUNTER — Inpatient Hospital Stay (HOSPITAL_COMMUNITY): Payer: 59

## 2011-07-04 DIAGNOSIS — E785 Hyperlipidemia, unspecified: Secondary | ICD-10-CM | POA: Diagnosis present

## 2011-07-04 DIAGNOSIS — M169 Osteoarthritis of hip, unspecified: Principal | ICD-10-CM | POA: Diagnosis present

## 2011-07-04 DIAGNOSIS — Z880 Allergy status to penicillin: Secondary | ICD-10-CM

## 2011-07-04 DIAGNOSIS — Z881 Allergy status to other antibiotic agents status: Secondary | ICD-10-CM

## 2011-07-04 DIAGNOSIS — Z7982 Long term (current) use of aspirin: Secondary | ICD-10-CM

## 2011-07-04 DIAGNOSIS — I10 Essential (primary) hypertension: Secondary | ICD-10-CM | POA: Diagnosis present

## 2011-07-04 DIAGNOSIS — M1610 Unilateral primary osteoarthritis, unspecified hip: Principal | ICD-10-CM | POA: Diagnosis present

## 2011-07-04 DIAGNOSIS — Z8614 Personal history of Methicillin resistant Staphylococcus aureus infection: Secondary | ICD-10-CM

## 2011-07-04 DIAGNOSIS — Z01812 Encounter for preprocedural laboratory examination: Secondary | ICD-10-CM

## 2011-07-04 DIAGNOSIS — G8929 Other chronic pain: Secondary | ICD-10-CM | POA: Diagnosis present

## 2011-07-04 DIAGNOSIS — Z981 Arthrodesis status: Secondary | ICD-10-CM

## 2011-07-04 DIAGNOSIS — Z79899 Other long term (current) drug therapy: Secondary | ICD-10-CM

## 2011-07-04 LAB — TYPE AND SCREEN
ABO/RH(D): O NEG
Antibody Screen: NEGATIVE

## 2011-07-04 LAB — ABO/RH: ABO/RH(D): O NEG

## 2011-07-05 LAB — CBC
MCH: 29.6 pg (ref 26.0–34.0)
MCHC: 32.7 g/dL (ref 30.0–36.0)
MCV: 90.5 fL (ref 78.0–100.0)
Platelets: 177 10*3/uL (ref 150–400)

## 2011-07-05 LAB — BASIC METABOLIC PANEL
Calcium: 9.6 mg/dL (ref 8.4–10.5)
Creatinine, Ser: 0.58 mg/dL (ref 0.50–1.10)
GFR calc non Af Amer: >60 mL/min (ref >60)
Glucose, Bld: 109 mg/dL — ABNORMAL HIGH (ref 70–99)
Sodium: 135 mEq/L (ref 135–145)

## 2011-07-06 ENCOUNTER — Inpatient Hospital Stay (HOSPITAL_COMMUNITY): Payer: 59

## 2011-07-06 LAB — CBC
MCH: 29.8 pg (ref 26.0–34.0)
Platelets: 164 10*3/uL (ref 150–400)
RBC: 3.49 MIL/uL — ABNORMAL LOW (ref 3.87–5.11)
RDW: 13.5 % (ref 11.5–15.5)

## 2011-07-06 LAB — BASIC METABOLIC PANEL
CO2: 30 mEq/L (ref 19–32)
Calcium: 10.2 mg/dL (ref 8.4–10.5)
Creatinine, Ser: 0.54 mg/dL (ref 0.50–1.10)
GFR calc Af Amer: >60 mL/min (ref >60)
GFR calc non Af Amer: >60 mL/min (ref >60)
Sodium: 134 mEq/L — ABNORMAL LOW (ref 135–145)

## 2011-07-07 NOTE — Op Note (Signed)
Doris [name], Doris [name] NO.:  [id number]  MEDICAL RECORD NO.:  [id number]  LOCATION:  1612                         FACILITY:  [facility]  PHYSICIAN:  Madlyn Frankel. Charlann Boxer, M.D.  DATE OF BIRTH:  Dec 21, 1945  DATE OF PROCEDURE:  07/04/2011 DATE OF DISCHARGE:                              OPERATIVE REPORT   PREOPERATIVE DIAGNOSIS:  Left hip osteoarthritis.  POSTOPERATIVE DIAGNOSIS:  Left hip osteoarthritis.  PROCEDURE:  Left total hip replacement using DePuy component size 50 Pinnacle cup, size 2 standard Tri-Lock stem with 32 +4 neutral AltrX liner, a 32 +5 Delta ceramic ball.  SURGEON:  Madlyn Frankel. Charlann Boxer, M.D.  ASSISTANT:  Lanney Gins, PA-C  ANESTHESIA:  General.  SPECIMENS:  None.  COMPLICATIONS:  None.  DRAINS:  One Hemovac.  BLOOD LOSS:  About 400 cc or less.  INDICATION OF THE PROCEDURE:  Doris [name] is a pleasant 65 year old female who presents to the office for left hip pain, following some back surgeries.  Radiographs revealed advanced degenerative changes.  She has had a significant reduction in her quality of life, was failing conservative measures at this point, now wished to proceed with total hip replacement despite issues that she had dealt with regarding her back surgery including an infection and requiring superficial debridement.  Risks of infection, DVT, component failure, dislocation were all discussed, reviewed the approach and anterior versus posterior approach were all reviewed.  Consent was obtained for benefit of pain relief.  PROCEDURE IN DETAIL:  The patient was brought to operative theater. Once adequate anesthesia, preoperative antibiotics, clindamycin administered.  The patient was positioned supine on the OSI [place] table. Bony prominences were padded and her left arm was adducted across her chest.  The left hip was pre-draped.  Fluoroscopy was used to confirm orientation of the pelvis.  The left hip was then prepped and  draped with shower curtain technique. Time-out was performed identifying the patient, planned procedure and extremity.  An incision was then made over the anterior thigh, 2 cm distal and lateral to the anterior-superior spine.  Sharp dissection was carried to the tensor fascia muscle.  The fascia was then incised and muscles swept laterally.  Retractor was placed along the superior neck.  The circumflex vessels and pericapsular fat were cauterized and debrided, and retractor placement along the inferior neck.  I then elevated the anterior rectus off the acetabulum and placed retractor.  A capsulotomy was made along the superior neck extending to the lesser trochanter and then approximately to the trochanteric fossa region.  Stay sutures were placed and retractors were placed intracapsular.  At this point, traction was applied to the hip.  Orientation of the neck osteotomy was made.  Neck osteotomy made.  Femoral head removed, noted be severely degenerative and arthritic.  Traction was taken off.  Retractor were placed posteriorly and the second one placed anterior and inferior capsule released.  Foveal tissue debrided as well.  She is noted have calcified labrum, which was removed anteriorly not to obscure orientation.  I then began reaming with a 44 reamer, reamed up to a 49 reamer, with the last reaming done under fluoroscopy to confirm orientation and depth of reaming,  a 50 Pinnacle cup was chosen, it was then impacted and had an excellent initial scratch fit.  Placed a single cancellous screw hole eliminator and the final 32 +4 neutral AlrX liner.  Attention was now directed to the femur.  The femoral hook was placed on lateral hip.  The hip was elevated manually and held in place with a hook.  The femur was externally rotated and the capsule removed off the inferior neck.  Retractor placed posteriorly.  The superior capsule was released and opened.  After removing posterior  trochanteric fossa tissue, box osteotome was used to open up the proximal femur.  I then began broaching with a starting broach, and then broached up by starting with a one broach up to a size two broach.  This seemed to have a very good medial and lateral metaphyseal fit.  At this point, I went ahead and did a trial reduction with a standard neck.  The stem appeared to be well-fitting centered within the femur.  I felt like I was getting a little bit more offset on radiographs.  I chose this as my final stem based on the fit and fill on the radiographs.  I did choose a high offset neck.  The final 2 high Tri-Lock stem was opened.  The trial components removed.  Based on her radiographs and intraoperative, I chose to use a +5 as a trial with this I felt offset was great and leg lengths appeared to be equal on the radiographs.  The final 32 +5 Delta ceramic ball was chosen and impacted onto clean and dry trunnion and the hip reduced.  We irrigated the hip throughout the case.  Again at this point, I reapproximated the anterior capsular tissues to one another.  Medium Hemovac drain was placed deep to the fascia with the tensor fascia lata muscle was then reapproximated using #1 Vicryl.  The remainder of the wound was closed with 2-0 Vicryl and running 4-0 Monocryl.  The hip was cleaned, dried, dressed sterilely using Dermabond and Aquacel dressing.  Draining site dressed separately.  She was brought to recovery room in stable condition, tolerating the procedure well.     Madlyn Frankel Charlann Boxer, M.D.     MDO/MEDQ  D:  07/04/2011  T:  07/04/2011  Job:  [id number]  Electronically Signed by Durene Romans M.D. on 07/07/2011 07:31:44 AM

## 2011-07-10 NOTE — Op Note (Signed)
  [name], [name] NO.:  [id number]  MEDICAL RECORD NO.:  [id number]  LOCATION:  1612                         FACILITY:  [facility]  PHYSICIAN:  Madlyn Frankel. Charlann Boxer, M.D.  DATE OF BIRTH:  03-May-1946  DATE OF PROCEDURE:  07/04/2011 DATE OF DISCHARGE:                              OPERATIVE REPORT   ADDENDUM:  The assistance from physician's assistant, Lanney Gins, was necessary for the entire portion of the procedure from preoperative positioning, perioperative positioning, retractor placement and the general facilitation of the case.  Would closure was also performed by the physician assistant.     Madlyn Frankel Charlann Boxer, M.D.     MDO/MEDQ  D:  07/07/2011  T:  07/07/2011  Job:  [id number]  Electronically Signed by Durene Romans M.D. on 07/10/2011 09:49:53 AM

## 2011-07-10 NOTE — Discharge Summary (Signed)
Doris [name], Doris [name] NO.:  [id number]  MEDICAL RECORD NO.:  [id number]  LOCATION:  1612                         FACILITY:  [facility]  PHYSICIAN:  Madlyn Frankel. Charlann Boxer, M.D.  DATE OF BIRTH:  Dec 11, 1945  DATE OF ADMISSION:  07/04/2011 DATE OF DISCHARGE:  07/07/2011                              DISCHARGE SUMMARY   Patient of Dr. Charlann Boxer.  PROCEDURE:  Left total hip replacement.  ADMITTING DIAGNOSIS:  Osteoarthritis, left hip.  DISCHARGE DIAGNOSES: 1. Status post left total hip replacement. 2. Hyperlipidemia. 3. Hypertension. 4. Chronic pain. 5. History of methicillin-resistant Staphylococcus aureus infections. 6. Spinal surgery.  HISTORY OF PRESENT ILLNESS:  The patient is a 65 year old lady with a history of osteoarthritis of left hip with failure of conservative treatment.  X-rays in the clinic did show arthritic changes of the left hip.  Options were discussed, the patient wished to proceed with surgery.  Risks, benefits, and expectations of procedure were discussed with the patient.  The patient understood risks, benefits, and expectations and wished to proceed with a left total hip replacement by Dr. Charlann Boxer.  HOSPITAL COURSE:  The patient underwent the above-stated procedure on July 04, 2011.  The patient tolerated the procedure well, was brought to the recovery room in good condition, and subsequently to the floor.  Postop day #1, July 05, 2011, the patient doing well, no events, afebrile, vital signs stable.  Hemoglobin and hematocrit 10.3/31.5. Dressings looked good, clean, dry, and intact.  The patient is distally neurovascularly intact.  The Hemovac was removed.  The patient had physical therapy.  Postop day #2, July 06, 2011, the patient doing well, no events, afebrile, vital signs stable.  Hematocrit is 31.3. Distally neurovascularly intact lower left extremity.  The patient had physical therapy, and was unable to put weight down on  her right foot. An x-rays  was ordered to see if there were any acute boney abnormalities.  Postop day #3, July 07, 2011, the patient doing well, no events, afebrile, vital signs stable. No new labs were done. Patient was distally neurovascularly intact lower left extremity.  The patient had physical therapy. It was felt that patient was doing well enough to be discharged home.  DISCHARGE CONDITION:  Good.  DISCHARGE INSTRUCTIONS:  The patient will be discharged home with home health on July 06, 2011.  The patient will be weightbearing as tolerated.  The patient will maintain her surgical dressing for about 8 days after which time she will replace with gauze and tape.  The patient will keep the area dry and clean until followup.  The patient will follow up at [facility] in 2 weeks. The patient knows to call with any questions or concerns.  DISCHARGE MEDICATIONS: 1. Enteric-coated aspirin 325 mg 1 p.o. b.i.d. for 4 weeks. 2. Tylenol 325 mg 1-2 p.o. q.4 h. p.r.n. pain. 3. Colace 100 mg 1 p.o. b.i.d. constipation. 4. Iron sulfate 325 mg 1 p.o. t.i.d. for 2-3 weeks. 5. Robaxin 500 mg 1 p.o. q.6 h. p.r.n. muscle spasm. 6. MiraLax 17 g p.o. b.i.d. constipation. 7. Tramadol 50 mg 1-2 p.o. q.4-6 h. p.r.n. pain. 8. Benazepril 20 mg 1 p.o. q.a.m. 9. Cymbalta 30  mg 2 p.o. daily. 10.Klor-Con 20 mEq 1 tablet in the morning with food Sunday, Tuesday,     Thursday, and Saturday; the patient takes 2 tablets on Mondays,     Wednesdays, and Fridays. 11.Pregabalin 75 mg 1 p.o. b.i.d. 12.Multivitamin 1 p.o. daily. 13.Triamterene/HCTZ 37.5/25 mg 1 p.o. q.a.m. 14.Vytorin 10/40 mg 1 p.o. q.a.m.    ______________________________ Lanney Gins, PA   ______________________________ Madlyn Frankel. Charlann Boxer, M.D.    MB/MEDQ  D:  07/06/2011  T:  07/06/2011  Job:  [id number]  Electronically Signed by Lanney Gins PA on 07/07/2011 09:08:47 AM Electronically Signed by Durene Romans M.D.  on 07/10/2011 09:49:44 AM

## 2011-07-24 NOTE — H&P (Signed)
Doris [name], BAGENT NO.:  [id number]  MEDICAL RECORD NO.:  [id number]  LOCATION:                                 FACILITY:  PHYSICIAN:  Jaquelyn Bitter. Chabon, P.A.DATE OF BIRTH:  11/23/1945  DATE OF ADMISSION: DATE OF DISCHARGE:                             HISTORY & PHYSICAL   DATE OF SURGERY:  July 04, 2011.  ADMITTING DIAGNOSIS:  Osteoarthritis, left hip.  HISTORY OF PRESENT ILLNESS:  This is a 64-year lady with a history of osteoarthritis of the left hip with failure of conservative treatment to alleviate her symptoms.  After discussion of treatments, benefits, risks, and options, she is now scheduled for anterior total hip arthroplasty of the left hip.  The surgery risks, benefits, and aftercare were discussed in detail with the patient; questions invited and answered.  Note that she has had multiple back surgeries with MRSA infection.  Per her medical doctors, if she needs to be on an antibiotic, Cubicin would be the one as she has significant allergic reactions to many other antibiotics.  Note that her medical doctor is Dr. Clarene Duke, as well as her cardiologist.  She will be going home after surgery and staying with her daughter.  She is given her postop medications today of aspirin, Robaxin, iron, MiraLax, and Colace.  PAST MEDICAL HISTORY:  Drug allergy to: 1. VANCOMYCIN with severe rash and hypotension. 2. PENICILLIN with rash and whelps. 3. CODEINE with nausea and vomiting, and 4. RIFAMPIN with severe rash and hypotension.  CURRENT MEDICATIONS: 1. Triamterene and hydrochlorothiazide 37.5/25 one daily. 2. Vytorin 10/40 one daily. 3. Klor-Con 1 tablet on Tuesday, Thursday, Saturday, Sunday;  two     tablets on Monday, Wednesday, Friday. 4. Aspirin 81 mg daily. 5. Lotensin. 6. Benazepril 20 mg one daily. 7. Tramadol 50 mg q.6 h. p.r.n. 8. Lyrica 75 mg one b.i.d. 9. Cymbalta 30 mg one b.i.d.  SERIOUS MEDICAL ILLNESSES:  Include: 1.  Hypertension. 2. Hyperlipidemia. 3. History of MRSA infection with spinal surgery. 4. Chronic pain.  PREVIOUS SURGERIES:  Include L4-L5 fusion with subsequent MRSA infection, multiple surgeries for I and D and cervical C4, C5, C6 and C7 fusion.  FAMILY HISTORY:  Positive for CVA, heart attack.  SOCIAL HISTORY:  The patient is divorced.  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  She does not smoke and does not drink.  She lives alone and will be staying with her daughter postoperatively.  REVIEW OF SYSTEMS:  CENTRAL NERVOUS SYSTEM:  Negative for headache, blurred vision, or dizziness.  PULMONARY:  Negative shortness breath, PND, and orthopnea.  CARDIOVASCULAR:  Negative for chest pain or palpitation.  Positive for history of hypertension.  GI:  Negative for ulcers, hepatitis.  GU:  Negative for urinary tract difficulties. MUSCULOSKELETAL:  Positive per HPI.  PHYSICAL EXAMINATION:  VITAL SIGNS:  BP 128/78, respirations 16, pulse 74 and regular. GENERAL:  This is a well-developed well-nourished lady in no acute distress. HEENT:  Head normocephalic.  Nose patent. Ears patent.  Pupils equal, round and reactive to light.  Throat without injection. NECK:  Supple without adenopathy.  Carotids 2+ without bruit. CHEST:  Clear  to auscultation.  No rales or rhonchi.  Respirations 16. The patient does have a 10 cm round, soft mass on her left scapular area that has been there for about a year and a half.  She has been told it is a probable lipoma, but has not had it worked up.  There is no pain involved with this. HEART:  Regular rate and rhythm at 74 beats per minute without murmur. ABDOMEN:  Soft with active bowel sounds.  No masses, organomegaly. NEUROLOGIC:  The patient is alert, oriented to time, place, and person. Cranial nerves II-XII grossly intact. EXTREMITIES:  Shows the left hip with decreased range of motion with pain to any range of motion.   Sensation and circulation are intact.  IMPRESSION:  Left hip osteoarthritis.  PLAN:  Left total hip arthroplasty.  Note that the patient is a candidate for trans-Cinnamic acid and will receive that at surgery.     Jaquelyn Bitter. Doris [name]     SJC/MEDQ  D:  06/21/2011  T:  06/21/2011  Job:  [id number]  Electronically Signed by Jodene Nam P.A. on 06/27/2011 09:25:32 AM Electronically Signed by Durene Romans M.D. on 07/24/2011 09:03:37 AM

## 2011-07-26 LAB — ABO/RH: ABO/RH(D): O NEG

## 2011-07-26 LAB — COMPREHENSIVE METABOLIC PANEL
ALT: 29
AST: 22
Albumin: 4.3
Alkaline Phosphatase: 54
CO2: 28
Chloride: 104
GFR calc Af Amer: >60
GFR calc non Af Amer: >60
Potassium: 3.9
Sodium: 140
Total Bilirubin: 0.5

## 2011-07-26 LAB — TYPE AND SCREEN: ABO/RH(D): O NEG

## 2011-07-26 LAB — GLUCOSE, CAPILLARY: Glucose-Capillary: 151 — ABNORMAL HIGH

## 2011-07-26 LAB — CBC
MCV: 94.2
Platelets: 194
RBC: 4.62
WBC: 8.1

## 2011-07-28 LAB — URINALYSIS, ROUTINE W REFLEX MICROSCOPIC
Bilirubin Urine: NEGATIVE
Ketones, ur: 15 mg/dL — AB
Nitrite: NEGATIVE
Protein, ur: NEGATIVE mg/dL
Urobilinogen, UA: 1 mg/dL (ref 0.0–1.0)

## 2011-07-28 LAB — COMPREHENSIVE METABOLIC PANEL
ALT: 78 U/L — ABNORMAL HIGH (ref 0–35)
Alkaline Phosphatase: 91 U/L (ref 39–117)
CO2: 30 mEq/L (ref 19–32)
GFR calc non Af Amer: >60 mL/min (ref >60)
Glucose, Bld: 136 mg/dL — ABNORMAL HIGH (ref 70–99)
Potassium: 4.2 mEq/L (ref 3.5–5.1)
Sodium: 134 mEq/L — ABNORMAL LOW (ref 135–145)
Total Protein: 6 g/dL (ref 6.0–8.3)

## 2011-07-28 LAB — CBC
HCT: 36.5 % (ref 36.0–46.0)
HCT: 38.2 % (ref 36.0–46.0)
HCT: 32 % — ABNORMAL LOW (ref 36.0–46.0)
HCT: 32.7 % — ABNORMAL LOW (ref 36.0–46.0)
Hemoglobin: 12.8 g/dL (ref 12.0–15.0)
Hemoglobin: 11.1 g/dL — ABNORMAL LOW (ref 12.0–15.0)
MCHC: 33.5 g/dL (ref 30.0–36.0)
MCV: 89.7 fL (ref 78.0–100.0)
MCV: 92.1 fL (ref 78.0–100.0)
MCV: 90.9 fL (ref 78.0–100.0)
MCV: 92.3 fL (ref 78.0–100.0)
Platelets: 333 10*3/uL (ref 150–400)
Platelets: 285 10*3/uL (ref 150–400)
Platelets: 300 10*3/uL (ref 150–400)
Platelets: 336 10*3/uL (ref 150–400)
RBC: 4.06 MIL/uL (ref 3.87–5.11)
RBC: 3.63 MIL/uL — ABNORMAL LOW (ref 3.87–5.11)
RDW: 12.9 % (ref 11.5–15.5)
RDW: 13 % (ref 11.5–15.5)
RDW: 13.1 % (ref 11.5–15.5)
RDW: 13.2 % (ref 11.5–15.5)
WBC: 22.4 10*3/uL — ABNORMAL HIGH (ref 4.0–10.5)

## 2011-07-28 LAB — CULTURE, BLOOD (ROUTINE X 2): Culture: NO GROWTH

## 2011-07-28 LAB — BASIC METABOLIC PANEL
BUN: 8 mg/dL (ref 6–23)
BUN: 2 mg/dL — ABNORMAL LOW (ref 6–23)
BUN: 5 mg/dL — ABNORMAL LOW (ref 6–23)
BUN: 6 mg/dL (ref 6–23)
BUN: <1 mg/dL — ABNORMAL LOW (ref 6–23)
CO2: 27 mEq/L (ref 19–32)
CO2: 27 mEq/L (ref 19–32)
Calcium: 10.6 mg/dL — ABNORMAL HIGH (ref 8.4–10.5)
Calcium: 11.6 mg/dL — ABNORMAL HIGH (ref 8.4–10.5)
Chloride: 96 mEq/L (ref 96–112)
Chloride: 97 mEq/L (ref 96–112)
Chloride: 101 mEq/L (ref 96–112)
Chloride: 106 mEq/L (ref 96–112)
Creatinine, Ser: 0.45 mg/dL (ref 0.4–1.2)
Creatinine, Ser: 0.46 mg/dL (ref 0.4–1.2)
Creatinine, Ser: 0.53 mg/dL (ref 0.4–1.2)
Creatinine, Ser: 0.53 mg/dL (ref 0.4–1.2)
GFR calc Af Amer: >60 mL/min (ref >60)
GFR calc Af Amer: >60 mL/min (ref >60)
GFR calc Af Amer: >60 mL/min (ref >60)
GFR calc non Af Amer: >60 mL/min (ref >60)
GFR calc non Af Amer: >60 mL/min (ref >60)
GFR calc non Af Amer: >60 mL/min (ref >60)
GFR calc non Af Amer: >60 mL/min (ref >60)
GFR calc non Af Amer: >60 mL/min (ref >60)
Glucose, Bld: 140 mg/dL — ABNORMAL HIGH (ref 70–99)
Glucose, Bld: 135 mg/dL — ABNORMAL HIGH (ref 70–99)
Glucose, Bld: 138 mg/dL — ABNORMAL HIGH (ref 70–99)
Glucose, Bld: 144 mg/dL — ABNORMAL HIGH (ref 70–99)
Potassium: 3.8 mEq/L (ref 3.5–5.1)
Potassium: 3.8 mEq/L (ref 3.5–5.1)
Potassium: 4.1 mEq/L (ref 3.5–5.1)
Sodium: 133 mEq/L — ABNORMAL LOW (ref 135–145)
Sodium: 138 mEq/L (ref 135–145)

## 2011-07-28 LAB — GRAM STAIN

## 2011-07-28 LAB — WOUND CULTURE

## 2011-07-28 LAB — CLOSTRIDIUM DIFFICILE EIA: C difficile Toxins A+B, EIA: NEGATIVE

## 2011-07-28 LAB — VANCOMYCIN, TROUGH
Vancomycin Tr: 14 ug/mL (ref 10.0–20.0)
Vancomycin Tr: <5 ug/mL — ABNORMAL LOW (ref 10.0–20.0)

## 2011-07-28 LAB — DIFFERENTIAL
Basophils Relative: 0 % (ref 0–1)
Eosinophils Relative: 0 % (ref 0–5)
Monocytes Relative: 5 % (ref 3–12)
Neutrophils Relative %: 88 % — ABNORMAL HIGH (ref 43–77)

## 2011-07-28 LAB — ANAEROBIC CULTURE

## 2011-08-01 ENCOUNTER — Ambulatory Visit: Payer: 59 | Attending: Orthopedic Surgery

## 2011-08-01 DIAGNOSIS — R269 Unspecified abnormalities of gait and mobility: Secondary | ICD-10-CM | POA: Insufficient documentation

## 2011-08-01 DIAGNOSIS — M6281 Muscle weakness (generalized): Secondary | ICD-10-CM | POA: Insufficient documentation

## 2011-08-01 DIAGNOSIS — IMO0001 Reserved for inherently not codable concepts without codable children: Secondary | ICD-10-CM | POA: Insufficient documentation

## 2011-08-01 DIAGNOSIS — R5381 Other malaise: Secondary | ICD-10-CM | POA: Insufficient documentation

## 2011-08-03 ENCOUNTER — Ambulatory Visit: Payer: 59 | Admitting: Physical Therapy

## 2011-08-08 ENCOUNTER — Ambulatory Visit: Payer: 59 | Admitting: Physical Therapy

## 2011-08-09 ENCOUNTER — Other Ambulatory Visit: Payer: Self-pay | Admitting: Dermatology

## 2011-08-10 ENCOUNTER — Ambulatory Visit (HOSPITAL_COMMUNITY)
Admission: RE | Admit: 2011-08-10 | Discharge: 2011-08-10 | Disposition: A | Discharge status: Home / Self Care | Payer: 59 | Source: Ambulatory Visit | Attending: Obstetrics and Gynecology | Admitting: Obstetrics and Gynecology

## 2011-08-10 ENCOUNTER — Ambulatory Visit: Payer: 59 | Admitting: Physical Therapy

## 2011-08-10 ENCOUNTER — Other Ambulatory Visit (HOSPITAL_COMMUNITY): Payer: Self-pay | Admitting: Obstetrics and Gynecology

## 2011-08-10 DIAGNOSIS — Z1231 Encounter for screening mammogram for malignant neoplasm of breast: Secondary | ICD-10-CM | POA: Insufficient documentation

## 2011-08-15 ENCOUNTER — Ambulatory Visit: Payer: 59 | Admitting: Physical Therapy

## 2011-08-17 ENCOUNTER — Ambulatory Visit: Payer: 59 | Admitting: Physical Therapy

## 2011-08-22 ENCOUNTER — Ambulatory Visit: Payer: 59 | Admitting: Physical Therapy

## 2011-08-24 ENCOUNTER — Ambulatory Visit: Payer: 59 | Attending: Orthopedic Surgery | Admitting: Physical Therapy

## 2011-08-24 DIAGNOSIS — R269 Unspecified abnormalities of gait and mobility: Secondary | ICD-10-CM | POA: Insufficient documentation

## 2011-08-24 DIAGNOSIS — R5381 Other malaise: Secondary | ICD-10-CM | POA: Insufficient documentation

## 2011-08-24 DIAGNOSIS — IMO0001 Reserved for inherently not codable concepts without codable children: Secondary | ICD-10-CM | POA: Insufficient documentation

## 2011-08-24 DIAGNOSIS — M6281 Muscle weakness (generalized): Secondary | ICD-10-CM | POA: Insufficient documentation

## 2011-08-29 ENCOUNTER — Encounter: Payer: 59 | Admitting: Physical Therapy

## 2011-08-31 ENCOUNTER — Encounter: Payer: 59 | Admitting: Physical Therapy

## 2012-06-25 ENCOUNTER — Other Ambulatory Visit: Payer: Self-pay | Admitting: Family Medicine

## 2012-06-25 DIAGNOSIS — E210 Primary hyperparathyroidism: Secondary | ICD-10-CM

## 2012-06-28 ENCOUNTER — Other Ambulatory Visit: Payer: Self-pay | Admitting: Obstetrics and Gynecology

## 2012-06-28 DIAGNOSIS — Z1231 Encounter for screening mammogram for malignant neoplasm of breast: Secondary | ICD-10-CM

## 2012-07-01 ENCOUNTER — Ambulatory Visit (HOSPITAL_COMMUNITY)
Admission: RE | Admit: 2012-07-01 | Discharge: 2012-07-01 | Disposition: A | Discharge status: Home / Self Care | Payer: 59 | Source: Ambulatory Visit | Attending: Obstetrics and Gynecology | Admitting: Obstetrics and Gynecology

## 2012-07-01 DIAGNOSIS — Z1231 Encounter for screening mammogram for malignant neoplasm of breast: Secondary | ICD-10-CM | POA: Insufficient documentation

## 2012-07-10 ENCOUNTER — Encounter: Payer: Self-pay | Admitting: Obstetrics and Gynecology

## 2012-07-10 ENCOUNTER — Ambulatory Visit (INDEPENDENT_AMBULATORY_CARE_PROVIDER_SITE_OTHER): Payer: 59 | Admitting: Obstetrics and Gynecology

## 2012-07-10 VITALS — BP 132/72 | Ht 62.75 in | Wt 180.0 lb

## 2012-07-10 DIAGNOSIS — Z124 Encounter for screening for malignant neoplasm of cervix: Secondary | ICD-10-CM

## 2012-07-10 DIAGNOSIS — Z87898 Personal history of other specified conditions: Secondary | ICD-10-CM | POA: Insufficient documentation

## 2012-07-10 DIAGNOSIS — Z8742 Personal history of other diseases of the female genital tract: Secondary | ICD-10-CM

## 2012-07-10 NOTE — Progress Notes (Signed)
AEX:  Last Pap: 08/21/2011 WNL: Yes Regular Periods:no Contraception: Post-Menopause  Monthly Breast exam:yes Tetanus<21yrs:yes Nl.Bladder Function:yes Daily BMs:yes Healthy Diet:yes Calcium:no Mammogram:yes Date of Mammogram: 07/02/2012 Exercise:yes Have often Exercise: daily walk 30 minutes Seatbelt: yes Abuse at home: no Stressful work:no Sigmoid-colonoscopy: 2005 Bone Density: Yes 2005 [facility] PCP: Selena Batten Change in PMH: None Change in [facility]: None  Subjective:    Doris [name] is a 66 y.o. female G2P2 who presents for annual exam.  The patient has no GYN complaints today. She is in the midst of a workup of elevated calcium and will undergo a neck U/S on 07/25/12, then a visit with Dr. Doristine Johns at [facility] on 07/29/12. She has a hx of abnl paps and was rx'd with cryotherapy in 1993  The following portions of the patient's history were reviewed and updated as appropriate: allergies, current medications, past family history, past medical history, past social history, past surgical history and problem list.  Review of Systems Pertinent items are noted in HPI. Gastrointestinal:No change in bowel habits, no abdominal pain, no rectal bleeding Genitourinary:negative for dysuria, frequency, hematuria, nocturia and urinary incontinence    Objective:     BP 132/72  Ht 5' 2.75" (1.594 m)  Wt 180 lb (81.647 kg)  BMI 32.14 kg/m2  Weight:  Wt Readings from Last 1 Encounters:  07/10/12 180 lb (81.647 kg)     BMI: Body mass index is 32.14 kg/(m^2). General Appearance: Alert, appropriate appearance for age. No acute distress HEENT: Grossly normal Neck / Thyroid: Supple, no masses, nodes or enlargement Lungs: clear to auscultation bilaterally Back: No CVA tenderness Breast Exam: No masses or nodes.No dimpling, nipple retraction or discharge. Cardiovascular: Regular rate and rhythm. S1, S2, no murmur Gastrointestinal: Soft, non-tender, no masses or organomegaly Pelvic  Exam: External genitalia: normal general appearance Vaginal: atrophic mucosa Cervix: atrophic with exocervical contact bleeding Adnexa: non palpable Uterus: doesn't feel enlarged Exam limited by body habitus Rectovaginal: normal rectal, no masses Lymphatic Exam: Non-palpable nodes in neck, clavicular, axillary, or inguinal regions Skin: no rash or abnormalities Neurologic: Normal gait and speech, no tremor  Psychiatric: Alert and oriented, appropriate affect.    Urinalysis:Not done    Assessment:    Menopause hx CIN I  treated 1993 with cryotherapy, nl since  Recent dx hypercalcemia  Plan:   mammogram pap smear return annually or prn Follow-up:  for annual exam

## 2012-07-11 LAB — PAP IG W/ RFLX HPV ASCU

## 2012-07-26 ENCOUNTER — Ambulatory Visit
Admission: RE | Admit: 2012-07-26 | Discharge: 2012-07-26 | Disposition: A | Discharge status: Home / Self Care | Payer: Medicare Other | Source: Ambulatory Visit | Attending: Family Medicine | Admitting: Family Medicine

## 2012-07-26 DIAGNOSIS — E210 Primary hyperparathyroidism: Secondary | ICD-10-CM

## 2012-07-29 ENCOUNTER — Ambulatory Visit (INDEPENDENT_AMBULATORY_CARE_PROVIDER_SITE_OTHER): Payer: Commercial Managed Care - PPO | Admitting: Surgery

## 2012-07-29 ENCOUNTER — Other Ambulatory Visit (HOSPITAL_COMMUNITY): Payer: Self-pay | Admitting: Family Medicine

## 2012-07-29 DIAGNOSIS — E213 Hyperparathyroidism, unspecified: Secondary | ICD-10-CM

## 2012-07-29 DIAGNOSIS — E0590 Thyrotoxicosis, unspecified without thyrotoxic crisis or storm: Secondary | ICD-10-CM

## 2012-07-30 ENCOUNTER — Other Ambulatory Visit: Payer: 59

## 2012-08-02 DIAGNOSIS — M169 Osteoarthritis of hip, unspecified: Secondary | ICD-10-CM | POA: Diagnosis not present

## 2012-08-06 ENCOUNTER — Encounter (HOSPITAL_COMMUNITY)
Admission: RE | Admit: 2012-08-06 | Discharge: 2012-08-06 | Disposition: A | Discharge status: Home / Self Care | Payer: Medicare Other | Source: Ambulatory Visit | Attending: Family Medicine | Admitting: Family Medicine

## 2012-08-06 DIAGNOSIS — E213 Hyperparathyroidism, unspecified: Secondary | ICD-10-CM | POA: Diagnosis not present

## 2012-08-06 MED ORDER — TECHNETIUM TC 99M SESTAMIBI GENERIC - CARDIOLITE
25.0000 | Freq: Once | INTRAVENOUS | Status: AC | PRN
  Administered 2012-08-06: 25 via INTRAVENOUS

## 2012-08-12 ENCOUNTER — Ambulatory Visit (HOSPITAL_COMMUNITY): Payer: 59

## 2012-08-12 ENCOUNTER — Ambulatory Visit (INDEPENDENT_AMBULATORY_CARE_PROVIDER_SITE_OTHER): Payer: Self-pay | Admitting: Surgery

## 2012-08-27 DIAGNOSIS — E210 Primary hyperparathyroidism: Secondary | ICD-10-CM | POA: Diagnosis not present

## 2012-08-30 DIAGNOSIS — E210 Primary hyperparathyroidism: Secondary | ICD-10-CM | POA: Diagnosis not present

## 2012-09-05 DIAGNOSIS — E210 Primary hyperparathyroidism: Secondary | ICD-10-CM | POA: Diagnosis not present

## 2012-10-02 DIAGNOSIS — E559 Vitamin D deficiency, unspecified: Secondary | ICD-10-CM | POA: Diagnosis not present

## 2012-10-02 DIAGNOSIS — E210 Primary hyperparathyroidism: Secondary | ICD-10-CM | POA: Diagnosis not present

## 2012-10-02 DIAGNOSIS — M899 Disorder of bone, unspecified: Secondary | ICD-10-CM | POA: Diagnosis not present

## 2012-10-11 DIAGNOSIS — J069 Acute upper respiratory infection, unspecified: Secondary | ICD-10-CM | POA: Diagnosis not present

## 2012-11-09 DIAGNOSIS — R1031 Right lower quadrant pain: Secondary | ICD-10-CM | POA: Diagnosis not present

## 2012-11-12 DIAGNOSIS — R109 Unspecified abdominal pain: Secondary | ICD-10-CM | POA: Diagnosis not present

## 2012-11-12 DIAGNOSIS — R300 Dysuria: Secondary | ICD-10-CM | POA: Diagnosis not present

## 2012-11-12 DIAGNOSIS — Z23 Encounter for immunization: Secondary | ICD-10-CM | POA: Diagnosis not present

## 2012-11-18 DIAGNOSIS — D72829 Elevated white blood cell count, unspecified: Secondary | ICD-10-CM | POA: Diagnosis not present

## 2013-02-20 DIAGNOSIS — I10 Essential (primary) hypertension: Secondary | ICD-10-CM | POA: Diagnosis not present

## 2013-02-20 DIAGNOSIS — E785 Hyperlipidemia, unspecified: Secondary | ICD-10-CM | POA: Diagnosis not present

## 2013-02-20 DIAGNOSIS — R7309 Other abnormal glucose: Secondary | ICD-10-CM | POA: Diagnosis not present

## 2013-03-31 DIAGNOSIS — E210 Primary hyperparathyroidism: Secondary | ICD-10-CM | POA: Diagnosis not present

## 2013-04-02 DIAGNOSIS — E210 Primary hyperparathyroidism: Secondary | ICD-10-CM | POA: Diagnosis not present

## 2013-04-02 DIAGNOSIS — R7301 Impaired fasting glucose: Secondary | ICD-10-CM | POA: Diagnosis not present

## 2013-04-02 DIAGNOSIS — M899 Disorder of bone, unspecified: Secondary | ICD-10-CM | POA: Diagnosis not present

## 2013-04-02 DIAGNOSIS — E559 Vitamin D deficiency, unspecified: Secondary | ICD-10-CM | POA: Diagnosis not present

## 2013-05-01 ENCOUNTER — Other Ambulatory Visit: Payer: Self-pay

## 2013-05-01 MED ORDER — TRIAMTERENE-HCTZ 37.5-25 MG PO TABS: 1.0000 | ORAL_TABLET | Freq: Every day | ORAL | Status: DC

## 2013-05-01 NOTE — Telephone Encounter (Signed)
Rx was sent to pharmacy electronically. 

## 2013-05-05 ENCOUNTER — Other Ambulatory Visit: Payer: Self-pay | Admitting: *Deleted

## 2013-06-02 ENCOUNTER — Other Ambulatory Visit: Payer: Self-pay | Admitting: Obstetrics and Gynecology

## 2013-06-02 DIAGNOSIS — Z1231 Encounter for screening mammogram for malignant neoplasm of breast: Secondary | ICD-10-CM

## 2013-06-17 ENCOUNTER — Encounter: Payer: Self-pay | Admitting: Cardiovascular Disease

## 2013-06-18 ENCOUNTER — Encounter: Payer: Self-pay | Admitting: Cardiovascular Disease

## 2013-06-18 ENCOUNTER — Ambulatory Visit (INDEPENDENT_AMBULATORY_CARE_PROVIDER_SITE_OTHER): Payer: Medicare Other | Admitting: Cardiovascular Disease

## 2013-06-18 ENCOUNTER — Ambulatory Visit: Payer: Medicare Other | Admitting: Cardiovascular Disease

## 2013-06-18 VITALS — BP 134/82 | HR 89 | Resp 16 | Ht 62.0 in | Wt 155.8 lb

## 2013-06-18 DIAGNOSIS — Z6825 Body mass index (BMI) 25.0-25.9, adult: Secondary | ICD-10-CM

## 2013-06-18 DIAGNOSIS — R739 Hyperglycemia, unspecified: Secondary | ICD-10-CM

## 2013-06-18 DIAGNOSIS — E7800 Pure hypercholesterolemia, unspecified: Secondary | ICD-10-CM | POA: Diagnosis not present

## 2013-06-18 DIAGNOSIS — E663 Overweight: Secondary | ICD-10-CM

## 2013-06-18 DIAGNOSIS — Z79899 Other long term (current) drug therapy: Secondary | ICD-10-CM | POA: Diagnosis not present

## 2013-06-18 DIAGNOSIS — R7309 Other abnormal glucose: Secondary | ICD-10-CM

## 2013-06-18 DIAGNOSIS — I10 Essential (primary) hypertension: Secondary | ICD-10-CM

## 2013-06-18 DIAGNOSIS — E785 Hyperlipidemia, unspecified: Secondary | ICD-10-CM

## 2013-06-18 NOTE — Patient Instructions (Addendum)
Your physician recommends that you schedule a follow-up appointment in: 12 months   Pt had a CMP in the last 6 month,only lipid ordered. (svm)

## 2013-06-20 ENCOUNTER — Telehealth: Payer: Self-pay | Admitting: Cardiovascular Disease

## 2013-06-20 DIAGNOSIS — E7800 Pure hypercholesterolemia, unspecified: Secondary | ICD-10-CM | POA: Diagnosis not present

## 2013-06-20 LAB — LIPID PANEL
HDL: 57 mg/dL (ref >39)
Total CHOL/HDL Ratio: 2.6 Ratio
VLDL: 16 mg/dL (ref 0–40)

## 2013-06-20 NOTE — Telephone Encounter (Signed)
Request  A labslip for patient  Dr C told pt if she had a cmp in the last few months not to do another one. Doris [name] needed a a new lab slip for lipids only sent to  Lab. Cancelled CMP and sent a LIPID to lab.

## 2013-06-21 DIAGNOSIS — R739 Hyperglycemia, unspecified: Secondary | ICD-10-CM | POA: Insufficient documentation

## 2013-06-21 DIAGNOSIS — I10 Essential (primary) hypertension: Secondary | ICD-10-CM | POA: Insufficient documentation

## 2013-06-21 DIAGNOSIS — E669 Obesity, unspecified: Secondary | ICD-10-CM | POA: Insufficient documentation

## 2013-06-21 NOTE — Assessment & Plan Note (Signed)
Good control today

## 2013-06-21 NOTE — Assessment & Plan Note (Signed)
The cholesterol profile was excellent even before her recent weight loss. The triglycerides were slightly high and I am confident that this is better now that she has lost the weight. Repeat a lipid profile. If there is substantial reduction in LDL cholesterol, might consider switching to simvastatin monotherapy.

## 2013-06-21 NOTE — Progress Notes (Signed)
Patient ID: Doris [name], female   DOB: 1946-03-01, 67 y.o.   MRN: [id number]     Reason for office visit Followup hypertension hyperlipidemia  This is my first encounter with "Doris [name], formerly cared for by Dr. Caprice Kluver. She has long-standing systemic hypertension, borderline hyperglycemia and hypercholesterolemia on statin therapy. Since her last appointment here a year ago she has managed to lose substantial weight. She has lost 27 pounds and has moved from obesity to moderately overweight status. Her BMI is now just over 28.  Her blood pressure is usually very well controlled and today is in the desirable range. Her last hemoglobin A1c was 6.1% but she does not take any medications for diabetes. She has been making efforts to improve her diet. She's not exercising a lot.    Allergies  Allergen Reactions  . Codeine     REACTION: upset stomach  . Hydrocodone-Acetaminophen     REACTION: becomes very sick  . Penicillins     REACTION: rash  . Rifampin     REACTION: rash  . Rocephin [Ceftriaxone Sodium In Dextrose]   . Vancomycin     REACTION: severe reaction    Current Outpatient Prescriptions  Medication Sig Dispense Refill  . alendronate (FOSAMAX) 70 MG tablet Take 70 mg by mouth every 7 (seven) days.      . benazepril (LOTENSIN) 20 MG tablet Take 20 mg by mouth daily.      . Cholecalciferol (VITAMIN D-3) 1000 UNITS CAPS Take 2,000 Units by mouth daily.      Marland Kitchen ezetimibe-simvastatin (VYTORIN) 10-40 MG per tablet Take 1 tablet by mouth at bedtime.      . Multiple Vitamin (MULTIVITAMIN) tablet Take 1 tablet by mouth daily.      . Potassium Chloride (KLOR-CON PO) Take 20 mEq by mouth daily.       . traMADol (ULTRAM) 50 MG tablet Take 50 mg by mouth every 6 (six) hours as needed.      . triamterene-hydrochlorothiazide (MAXZIDE-25) 37.5-25 MG per tablet Take 1 tablet by mouth daily.  90 tablet  0  . doxycycline (VIBRA-TABS) 100 MG tablet Take 100 mg by mouth as needed.  Prior to dental work       No current facility-administered medications for this visit.    Past Medical History  Diagnosis Date  . MRSA (methicillin resistant Staphylococcus aureus)   . Hypertension   . Degenerative disc disease   . Condyloma   . Abnormal uterine bleeding   . Dysplasia of cervix, low grade (CIN 1)   . Migraine   . Depression     Past Surgical History  Procedure Laterality Date  . Total hip arthroplasty    . Spine surgery    . Gynecologic cryosurgery      No family history on file.  History   Social History  . Marital Status: Divorced    Spouse Name: N/A    Number of Children: N/A  . Years of Education: N/A   Occupational History  . Not on file.   Social History Main Topics  . Smoking status: Never Smoker   . Smokeless tobacco: Never Used  . Alcohol Use: No  . Drug Use: No  . Sexual Activity: Not on file   Other Topics Concern  . Not on file   Social History Narrative  . No narrative on file    Review of systems: The patient specifically denies any chest pain at rest or with exertion, dyspnea at rest  or with exertion, orthopnea, paroxysmal nocturnal dyspnea, syncope, palpitations, focal neurological deficits, intermittent claudication, lower extremity edema, unexplained weight gain, cough, hemoptysis or wheezing.  The patient also denies abdominal pain, nausea, vomiting, dysphagia, diarrhea, constipation, polyuria, polydipsia, dysuria, hematuria, frequency, urgency, abnormal bleeding or bruising, fever, chills, unexpected weight changes, mood swings, change in skin or hair texture, change in voice quality, auditory or visual problems, allergic reactions or rashes, new musculoskeletal complaints other than usual "aches and pains".    PHYSICAL EXAM BP 134/82  Pulse 89  Resp 16  Ht 5\' 2"  (1.575 m)  Wt 155 lb 12.8 oz (70.67 kg)  BMI 28.49 kg/m2   General: Alert, oriented x3, no distress Head: no evidence of trauma, PERRL, EOMI, no  exophtalmos or lid lag, no myxedema, no xanthelasma; normal ears, nose and oropharynx Neck: normal jugular venous pulsations and no hepatojugular reflux; brisk carotid pulses without delay and no carotid bruits Chest: clear to auscultation, no signs of consolidation by percussion or palpation, normal fremitus, symmetrical and full respiratory excursions Cardiovascular: normal position and quality of the apical impulse, regular rhythm, normal first and second heart sounds, no rubs or gallops, early peaking 1/6 aortic ejection murmur Abdomen: no tenderness or distention, no masses by palpation, no abnormal pulsatility or arterial bruits, normal bowel sounds, no hepatosplenomegaly Extremities: no clubbing, cyanosis or edema; 2+ radial, ulnar and brachial pulses bilaterally; 2+ right femoral, posterior tibial and dorsalis pedis pulses; 2+ left femoral, posterior tibial and dorsalis pedis pulses; no subclavian or femoral bruits Neurological: grossly nonfocal   EKG: Sinus rhythm, normal tracing  Lipid Panel  Total cholesterol 178, triglycerides 195, HDL 45, LDL 94 August 2013  BMET    Component Value Date/Time   NA 134* 07/06/2011 0430   K 3.6 07/06/2011 0430   CL 99 07/06/2011 0430   CO2 30 07/06/2011 0430   GLUCOSE 144* 07/06/2011 0430   BUN 10 07/06/2011 0430   CREATININE 0.54 07/06/2011 0430   CALCIUM 10.2 07/06/2011 0430   GFRNONAA >60 07/06/2011 0430   GFRAA >60 07/06/2011 0430     ASSESSMENT AND PLAN HTN (hypertension) Good control today  Hyperglycemia Slightly elevated A1c, continue efforts at weight loss and carbohydrate restriction, increase exercise  HYPERLIPIDEMIA The cholesterol profile was excellent even before her recent weight loss. The triglycerides were slightly high and I am confident that this is better now that she has lost the weight. Repeat a lipid profile. If there is substantial reduction in LDL cholesterol, might consider switching to simvastatin  monotherapy.   Orders Placed This Encounter  Procedures  . Lipid panel  . EKG 12-Lead   Meds ordered this encounter  Medications  . alendronate (FOSAMAX) 70 MG tablet    Sig: Take 70 mg by mouth every 7 (seven) days.  Marland Kitchen doxycycline (VIBRA-TABS) 100 MG tablet    Sig: Take 100 mg by mouth as needed. Prior to dental work  . Cholecalciferol (VITAMIN D-3) 1000 UNITS CAPS    Sig: Take 2,000 Units by mouth daily.  . Multiple Vitamin (MULTIVITAMIN) tablet    Sig: Take 1 tablet by mouth daily.    Junious Silk, MD, [facility] [phone] office [phone] pager

## 2013-06-21 NOTE — Assessment & Plan Note (Signed)
Slightly elevated A1c, continue efforts at weight loss and carbohydrate restriction, increase exercise

## 2013-06-24 ENCOUNTER — Telehealth: Payer: Self-pay | Admitting: *Deleted

## 2013-06-24 MED ORDER — SIMVASTATIN 40 MG PO TABS: 40.0000 mg | ORAL_TABLET | Freq: Every day | ORAL | Status: DC

## 2013-06-24 NOTE — Telephone Encounter (Signed)
Lipid profile results called to patient.  Stated Dr. Salena Saner. Discussed switching vytorin to something cheaper if her numbers were good.  Discussed w/Dr. C OK to D/C vytorin and start simvastatin 40mg  qd.  Sent to pharmacy. Mailed patient a copy of labs.

## 2013-07-02 ENCOUNTER — Ambulatory Visit (HOSPITAL_COMMUNITY)
Admission: RE | Admit: 2013-07-02 | Discharge: 2013-07-02 | Disposition: A | Discharge status: Home / Self Care | Payer: Medicare Other | Source: Ambulatory Visit | Attending: Obstetrics and Gynecology | Admitting: Obstetrics and Gynecology

## 2013-07-02 DIAGNOSIS — Z1231 Encounter for screening mammogram for malignant neoplasm of breast: Secondary | ICD-10-CM | POA: Insufficient documentation

## 2013-07-09 DIAGNOSIS — Z23 Encounter for immunization: Secondary | ICD-10-CM | POA: Diagnosis not present

## 2013-07-22 DIAGNOSIS — D3130 Benign neoplasm of unspecified choroid: Secondary | ICD-10-CM | POA: Diagnosis not present

## 2013-07-28 DIAGNOSIS — Z01419 Encounter for gynecological examination (general) (routine) without abnormal findings: Secondary | ICD-10-CM | POA: Diagnosis not present

## 2013-07-28 DIAGNOSIS — Z124 Encounter for screening for malignant neoplasm of cervix: Secondary | ICD-10-CM | POA: Diagnosis not present

## 2013-08-04 ENCOUNTER — Other Ambulatory Visit: Payer: Self-pay | Admitting: Cardiovascular Disease

## 2013-08-05 NOTE — Telephone Encounter (Signed)
Rx was sent to pharmacy electronically. 

## 2013-09-16 ENCOUNTER — Other Ambulatory Visit: Payer: Self-pay | Admitting: Cardiovascular Disease

## 2013-09-16 NOTE — Telephone Encounter (Signed)
Rx was sent to pharmacy electronically. 

## 2013-09-26 DIAGNOSIS — J40 Bronchitis, not specified as acute or chronic: Secondary | ICD-10-CM | POA: Diagnosis not present

## 2013-09-30 DIAGNOSIS — E210 Primary hyperparathyroidism: Secondary | ICD-10-CM | POA: Diagnosis not present

## 2013-09-30 DIAGNOSIS — E559 Vitamin D deficiency, unspecified: Secondary | ICD-10-CM | POA: Diagnosis not present

## 2013-10-02 ENCOUNTER — Telehealth: Payer: Self-pay | Admitting: Cardiovascular Disease

## 2013-10-02 DIAGNOSIS — R7301 Impaired fasting glucose: Secondary | ICD-10-CM | POA: Diagnosis not present

## 2013-10-02 DIAGNOSIS — M899 Disorder of bone, unspecified: Secondary | ICD-10-CM | POA: Diagnosis not present

## 2013-10-02 DIAGNOSIS — E210 Primary hyperparathyroidism: Secondary | ICD-10-CM | POA: Diagnosis not present

## 2013-10-02 DIAGNOSIS — E559 Vitamin D deficiency, unspecified: Secondary | ICD-10-CM | POA: Diagnosis not present

## 2013-10-02 MED ORDER — FUROSEMIDE 40 MG PO TABS: 40.0000 mg | ORAL_TABLET | Freq: Every day | ORAL | Status: DC

## 2013-10-02 NOTE — Telephone Encounter (Signed)
Call to Dr. Sharl Ma.  Informed per Dr. Royann Shivers.  Dr. Sharl Ma verbalized understanding and stated he wants to know if Dr. Royann Shivers is okay with pt taking low dose furosemide to help her Ca++ in addition to the amlodipine.  Informed him Dr. Royann Shivers will be notified.  Message forwarded to Dr. Royann Shivers.

## 2013-10-02 NOTE — Telephone Encounter (Signed)
I think stopping the Triamterene/HCTZ is not a problem. We have plenty of HTN medication options. I would not choose furosemide (good diuretic but mediocre BP medication). I'd rather try amlodipine 5 mg daily. Start 1 week after she stops the Triam/HCTZ, which lasts a long time. Recheck BP 2-3 weeks after the change.

## 2013-10-02 NOTE — Telephone Encounter (Signed)
Dr. Sharl Ma from [facility] can be reached at [phone].  Stated pt has a primary dx of hyperparathyroidism r/t calcium and he has advised she stop triamterene/hctz and start a loop diuretic like furosemide.  Dr. Sharl Ma stated he and pt would like to know Dr. Erin Hearing views on this.  Stated he is willing to prescribe it if Dr. Royann Shivers would like him to.  Dr. Sharl Ma informed Dr. Royann Shivers will be notified and is in the office tomorrow.  Verbalized understanding and stated it is not urgent and call back not necessary today.  Message forwarded to Dr. Royann Shivers.

## 2013-10-02 NOTE — Telephone Encounter (Signed)
No answer at Dr. Daune Perch office.  Call to pt and left message w/ daughter, Victorino Dike [facility]) to call back.  Daughter's cell listed as pt's mobile number.  Daughter stated pt's cell is [phone].  Informed RN will call her.  Call to pt's cell and left message to call back today before 4pm.

## 2013-10-02 NOTE — Telephone Encounter (Signed)
Spoke to Dr. Sharl Ma. He prefers the furosemide since it will help with Calcium clearance. She has to stay well hydrated. Do not start the amlodipine, stop the triamterene/HCTZ and add furosemide 40 mg daily. Please encourage potassium rich foods and bring in for a BP check and BMET in 23 weeks (PA/NP OK).

## 2013-10-02 NOTE — Telephone Encounter (Signed)
Pt called back and informed MDs are still deciding on best plan of care.  Pt informed RN will call her once they have decided on what is best.  Pt verbalized understanding and agreed w/ plan.  Pt also stated she prefers to have a BP med w/o diuretic in it.  Pt has not stopped triam/hctz since she takes it in the AM and was just seen today.

## 2013-10-02 NOTE — Telephone Encounter (Signed)
Returned call and informed pt per instructions by MD.  Pt stated she is taking K+ and confirmed she is taking 20 mEq.  Stated K+ was 4.1 this week.  Pt informed furosemide may drop K+ a little lower and still advised K+ rich foods.  Pt verbalized understanding and agreed w/ plan.  Rx for furosemide sent to pharmacy.  BP check scheduled for 1.6.15 (per pt request) w/ Corine Shelter, PA-C and she will have labs drawn on 1.5.15.

## 2013-10-27 ENCOUNTER — Telehealth: Payer: Self-pay | Admitting: Cardiovascular Disease

## 2013-10-27 DIAGNOSIS — Z79899 Other long term (current) drug therapy: Secondary | ICD-10-CM | POA: Diagnosis not present

## 2013-10-27 LAB — BASIC METABOLIC PANEL
BUN: 18 mg/dL (ref 6–23)
CHLORIDE: 105 meq/L (ref 96–112)
CO2: 26 meq/L (ref 19–32)
CREATININE: 0.53 mg/dL (ref 0.50–1.10)
Calcium: 10.2 mg/dL (ref 8.4–10.5)
Glucose, Bld: 89 mg/dL (ref 70–99)
Potassium: 4 mEq/L (ref 3.5–5.3)
Sodium: 142 mEq/L (ref 135–145)

## 2013-10-27 NOTE — Telephone Encounter (Signed)
Order placed.  Call to [place] and informed.

## 2013-10-27 NOTE — Telephone Encounter (Signed)
Needs lab order to get labwork done for her appt tomorrow.  Please call . Patient in her office,

## 2013-10-28 ENCOUNTER — Ambulatory Visit (INDEPENDENT_AMBULATORY_CARE_PROVIDER_SITE_OTHER): Payer: Medicare Other | Admitting: Cardiology

## 2013-10-28 ENCOUNTER — Encounter: Payer: Self-pay | Admitting: Cardiology

## 2013-10-28 VITALS — BP 130/86 | HR 84 | Ht 62.5 in | Wt 155.2 lb

## 2013-10-28 DIAGNOSIS — I10 Essential (primary) hypertension: Secondary | ICD-10-CM | POA: Diagnosis not present

## 2013-10-28 NOTE — Patient Instructions (Signed)
Your physician recommends that you schedule a follow-up appointment in: Aug 2015

## 2013-10-28 NOTE — Assessment & Plan Note (Signed)
Being followed by Dr Rivka Barbara. She was just changed from HCTZ to Lasix, her renal function and B/P are stable.

## 2013-10-28 NOTE — Assessment & Plan Note (Signed)
Her B/P is currently well controlled.

## 2013-10-28 NOTE — Progress Notes (Signed)
10/28/2013 Doris [name]   [birth year]  [id number]  Primary Physicia NNODI, Doreene Burke, MD Primary Cardiologist: Dr Sallyanne Kuster  HPI:  68 y/o divorced female followed by Dr Sallyanne Kuster, formerly a pt of Dr Rex Kras, with a history of HTN and dyslipidemia. She recently has been seen by Dr Rivka Barbara secondary to hypercalcinemia. He HCTZ was changed to Lasix and she is here today to follow up he B/P and BMP. Her renal function is normal and her K+ is 4.0. B/P is adequately controlled.    Current Outpatient Prescriptions  Medication Sig Dispense Refill  . alendronate (FOSAMAX) 70 MG tablet Take 70 mg by mouth every 7 (seven) days.      . benazepril (LOTENSIN) 20 MG tablet TAKE 1 TABLET BY MOUTH ONCE DAILY  90 tablet  2  . Cholecalciferol (VITAMIN D-3) 1000 UNITS CAPS Take 2,000 Units by mouth daily.      Marland Kitchen doxycycline (VIBRA-TABS) 100 MG tablet Take 100 mg by mouth as needed. Prior to dental work      . furosemide (LASIX) 40 MG tablet Take 1 tablet (40 mg total) by mouth daily.  90 tablet  2  . Multiple Vitamin (MULTIVITAMIN) tablet Take 1 tablet by mouth daily.      . potassium chloride SA (K-DUR,KLOR-CON) 20 MEQ tablet TAKE 1 TABLET BY MOUTH ONCE DAILY  90 tablet  2  . simvastatin (ZOCOR) 40 MG tablet Take 1 tablet (40 mg total) by mouth at bedtime.  90 tablet  3  . traMADol (ULTRAM) 50 MG tablet Take 50 mg by mouth every 6 (six) hours as needed.       No current facility-administered medications for this visit.    Allergies  Allergen Reactions  . Codeine     REACTION: upset stomach  . Hydrocodone-Acetaminophen     REACTION: becomes very sick  . Penicillins     REACTION: rash  . Rifampin     REACTION: rash  . Rocephin [Ceftriaxone Sodium In Dextrose]   . Vancomycin     REACTION: severe reaction    History   Social History  . Marital Status: Divorced    Spouse Name: N/A    Number of Children: N/A  . Years of Education: N/A   Occupational History  . Not on file.   Social History Main  Topics  . Smoking status: Never Smoker   . Smokeless tobacco: Never Used  . Alcohol Use: No  . Drug Use: No  . Sexual Activity: Not on file   Other Topics Concern  . Not on file   Social History Narrative  . No narrative on file     Review of Systems: General: negative for chills, fever, night sweats or weight changes.  Cardiovascular: negative for chest pain, dyspnea on exertion, edema, orthopnea, palpitations, paroxysmal nocturnal dyspnea or shortness of breath Dermatological: negative for rash Respiratory: negative for cough or wheezing Urologic: negative for hematuria Abdominal: negative for nausea, vomiting, diarrhea, bright red blood per rectum, melena, or hematemesis Neurologic: negative for visual changes, syncope, or dizziness All other systems reviewed and are otherwise negative except as noted above.    Blood pressure 130/86, pulse 84, height 5' 2.5" (1.588 m), weight 155 lb 3.2 oz (70.398 kg).  General appearance: alert, cooperative and no distress    ASSESSMENT AND PLAN:   HTN (hypertension) Her B/P is currently well controlled.  Hypercalcemia Being followed by Dr Rivka Barbara. She was just changed from HCTZ to Lasix, her renal function and B/P  are stable.   PLAN  Continue current Rx. See Dr Sallyanne Kuster in one year.  Doris [name] KPA-C 10/28/2013 1:44 PM

## 2014-02-20 DIAGNOSIS — I10 Essential (primary) hypertension: Secondary | ICD-10-CM | POA: Diagnosis not present

## 2014-02-20 DIAGNOSIS — M549 Dorsalgia, unspecified: Secondary | ICD-10-CM | POA: Diagnosis not present

## 2014-03-17 ENCOUNTER — Telehealth: Payer: Self-pay | Admitting: Cardiovascular Disease

## 2014-03-17 DIAGNOSIS — E782 Mixed hyperlipidemia: Secondary | ICD-10-CM

## 2014-03-17 DIAGNOSIS — Z79899 Other long term (current) drug therapy: Secondary | ICD-10-CM

## 2014-03-17 NOTE — Telephone Encounter (Signed)
Pt has yearly appt w/ dr. c in august.  Wants to know if she needs labwork prior.

## 2014-03-17 NOTE — Telephone Encounter (Signed)
RN spoke to patient. She states that she has not seen her PCP THIS YEAR AND NOT SCHEDULE UNTIL SEPT 2015.  RN WILL MAIL HER LAB SLIP. RN SUGGESTED PATIENT TO CONTACT PCP TO SEE IF ANY OTHER LABS ARE NEEDED PATIENT VERBALIZED UNDERSTANDING.

## 2014-05-27 DIAGNOSIS — K5730 Diverticulosis of large intestine without perforation or abscess without bleeding: Secondary | ICD-10-CM | POA: Diagnosis not present

## 2014-05-27 DIAGNOSIS — Z1211 Encounter for screening for malignant neoplasm of colon: Secondary | ICD-10-CM | POA: Diagnosis not present

## 2014-06-09 DIAGNOSIS — E782 Mixed hyperlipidemia: Secondary | ICD-10-CM | POA: Diagnosis not present

## 2014-06-09 DIAGNOSIS — Z79899 Other long term (current) drug therapy: Secondary | ICD-10-CM | POA: Diagnosis not present

## 2014-06-09 LAB — LIPID PANEL
Cholesterol: 155 mg/dL (ref 0–200)
HDL: 52 mg/dL (ref >39)
LDL CALC: 75 mg/dL (ref 0–99)
Total CHOL/HDL Ratio: 3 Ratio
Triglycerides: 140 mg/dL (ref <150)
VLDL: 28 mg/dL (ref 0–40)

## 2014-06-09 LAB — COMPREHENSIVE METABOLIC PANEL
ALK PHOS: 50 U/L (ref 39–117)
ALT: 17 U/L (ref 0–35)
AST: 17 U/L (ref 0–37)
Albumin: 4.3 g/dL (ref 3.5–5.2)
BILIRUBIN TOTAL: 0.5 mg/dL (ref 0.2–1.2)
BUN: 16 mg/dL (ref 6–23)
CO2: 27 meq/L (ref 19–32)
Calcium: 11.5 mg/dL — ABNORMAL HIGH (ref 8.4–10.5)
Chloride: 102 mEq/L (ref 96–112)
Creat: 0.65 mg/dL (ref 0.50–1.10)
Glucose, Bld: 91 mg/dL (ref 70–99)
Potassium: 4.3 mEq/L (ref 3.5–5.3)
SODIUM: 140 meq/L (ref 135–145)
TOTAL PROTEIN: 7.2 g/dL (ref 6.0–8.3)

## 2014-06-12 ENCOUNTER — Encounter: Payer: Self-pay | Admitting: Cardiovascular Disease

## 2014-06-12 ENCOUNTER — Ambulatory Visit (INDEPENDENT_AMBULATORY_CARE_PROVIDER_SITE_OTHER): Payer: Medicare Other | Admitting: Cardiovascular Disease

## 2014-06-12 VITALS — BP 144/88 | HR 82 | Resp 16 | Ht 62.5 in | Wt 157.4 lb

## 2014-06-12 DIAGNOSIS — E785 Hyperlipidemia, unspecified: Secondary | ICD-10-CM

## 2014-06-12 DIAGNOSIS — E663 Overweight: Secondary | ICD-10-CM | POA: Diagnosis not present

## 2014-06-12 DIAGNOSIS — I10 Essential (primary) hypertension: Secondary | ICD-10-CM | POA: Diagnosis not present

## 2014-06-12 NOTE — Progress Notes (Signed)
Patient ID: Doris [name], female   DOB: 1946-08-31, 68 y.o.   MRN: [id number]     Reason for office visit HTN, hyperlipidemia  Doris [name] is now 68 years old and has well-controlled hypertension and hyperlipidemia on pharmacological therapy. She is controlling her diabetes with diet. Although she has not lost any more weight since last year, she has managed to avoid gaining any weight and remains in the moderately overweight range. She had been quite obese in the past. Her blood pressure is borderline elevated today but when she checks it at [employer] it is consistently in the 130s/80s. She has no cardiac complaints, despite the fact that she has been busy this summer taking care of 4 grandchildren. When they return to school she will return to her schedule of exercise at the [facility].  Her lipid profile is excellent on the current medical regimen. Her labs show recurrence of mild hypercalcemia, secondary to hyperparathyroidism. She is taking bisphosphonates and vitamin D 3.   Allergies  Allergen Reactions  . Clindamycin/Lincomycin Swelling  . Codeine     REACTION: upset stomach  . Doxycycline Swelling  . Hydrocodone-Acetaminophen     REACTION: becomes very sick  . Penicillins     REACTION: rash  . Rifampin     REACTION: rash  . Rocephin [Ceftriaxone Sodium In Dextrose]   . Vancomycin     REACTION: severe reaction    Current Outpatient Prescriptions  Medication Sig Dispense Refill  . alendronate (FOSAMAX) 70 MG tablet Take 70 mg by mouth every 7 (seven) days.      . benazepril (LOTENSIN) 20 MG tablet TAKE 1 TABLET BY MOUTH ONCE DAILY  90 tablet  2  . Cholecalciferol (VITAMIN D-3) 1000 UNITS CAPS Take 2,000 Units by mouth daily.      . furosemide (LASIX) 40 MG tablet Take 1 tablet (40 mg total) by mouth daily.  90 tablet  2  . Multiple Vitamin (MULTIVITAMIN) tablet Take 1 tablet by mouth daily.      . potassium chloride SA (K-DUR,KLOR-CON) 20 MEQ tablet TAKE 1 TABLET BY MOUTH ONCE  DAILY  90 tablet  2  . simvastatin (ZOCOR) 40 MG tablet Take 1 tablet (40 mg total) by mouth at bedtime.  90 tablet  3  . traMADol (ULTRAM) 50 MG tablet Take 50 mg by mouth every 6 (six) hours as needed.       No current facility-administered medications for this visit.    Past Medical History  Diagnosis Date  . MRSA (methicillin resistant Staphylococcus aureus) 2009    after back surg- #3 I&D surgewries after  . Hypertension   . Degenerative disc disease     C-spine, back, hip surgery  . Condyloma   . Abnormal uterine bleeding   . Dysplasia of cervix, low grade (CIN 1)   . Migraine   . Depression   . Hyperlipidemia   . Hypercalcemia     Past Surgical History  Procedure Laterality Date  . Total hip arthroplasty    . Spine surgery  2009  . Gynecologic cryosurgery    . Upper extremity venous doppler  11/14/2008    No evidence of DVT or superficial thrombosis of the right upper extremity  . Cardiovascular stress test  05/24/2011    No scintigraphic evidence of inducible myocardial ischemia. Alithough there are no reversible perfusion defects, the presence of a TID ratio >1.2 is concerning for multivessel disease.  . Cervical spine surgery      Family History  Problem Relation Age of Onset  . Heart disease Mother   . Stroke Mother   . Stroke Father   . Heart disease Father   . COPD Sister     History   Social History  . Marital Status: Divorced    Spouse Name: N/A    Number of Children: N/A  . Years of Education: N/A   Occupational History  . Not on file.   Social History Main Topics  . Smoking status: Never Smoker   . Smokeless tobacco: Never Used  . Alcohol Use: No  . Drug Use: No  . Sexual Activity: Not on file   Other Topics Concern  . Not on file   Social History Narrative  . No narrative on file    Review of systems: The patient specifically denies any chest pain at rest or with exertion, dyspnea at rest or with exertion, orthopnea, paroxysmal  nocturnal dyspnea, syncope, palpitations, focal neurological deficits, intermittent claudication, lower extremity edema, unexplained weight gain, cough, hemoptysis or wheezing.  The patient also denies abdominal pain, nausea, vomiting, dysphagia, diarrhea, constipation, polyuria, polydipsia, dysuria, hematuria, frequency, urgency, abnormal bleeding or bruising, fever, chills, unexpected weight changes, mood swings, change in skin or hair texture, change in voice quality, auditory or visual problems, allergic reactions or rashes.  She's having some problems with pain in her left knee  PHYSICAL EXAM BP 144/88  Pulse 82  Resp 16  Ht 5' 2.5" (1.588 m)  Wt 157 lb 6.4 oz (71.396 kg)  BMI 28.31 kg/m2  General: Alert, oriented x3, no distress, thoracic scoliosis Head: no evidence of trauma, PERRL, EOMI, no exophtalmos or lid lag, no myxedema, no xanthelasma; normal ears, nose and oropharynx Neck: normal jugular venous pulsations and no hepatojugular reflux; brisk carotid pulses without delay and no carotid bruits Chest: clear to auscultation, no signs of consolidation by percussion or palpation, normal fremitus, symmetrical and full respiratory excursions Cardiovascular: normal position and quality of the apical impulse, regular rhythm, normal first and second heart sounds, no murmurs, rubs or gallops Abdomen: no tenderness or distention, no masses by palpation, no abnormal pulsatility or arterial bruits, normal bowel sounds, no hepatosplenomegaly Extremities: no clubbing, cyanosis or edema; 2+ radial, ulnar and brachial pulses bilaterally; 2+ right femoral, posterior tibial and dorsalis pedis pulses; 2+ left femoral, posterior tibial and dorsalis pedis pulses; no subclavian or femoral bruits Neurological: grossly nonfocal   EKG: NSR  Lipid Panel     Component Value Date/Time   CHOL 155 06/09/2014 0911   TRIG 140 06/09/2014 0911   HDL 52 06/09/2014 0911   CHOLHDL 3.0 06/09/2014 0911   VLDL 28  06/09/2014 0911   LDLCALC 75 06/09/2014 0911    BMET    Component Value Date/Time   NA 140 06/09/2014 0911   K 4.3 06/09/2014 0911   CL 102 06/09/2014 0911   CO2 27 06/09/2014 0911   GLUCOSE 91 06/09/2014 0911   BUN 16 06/09/2014 0911   CREATININE 0.65 06/09/2014 0911   CREATININE 0.54 07/06/2011 0430   CALCIUM 11.5* 06/09/2014 0911   GFRNONAA >60 07/06/2011 0430   GFRAA >60 07/06/2011 0430     ASSESSMENT AND PLAN  Mrs. Sorce is cardiovascular risk factors are all well controlled. Her blood pressure is slightly elevated today but usually in the desirable range. Her lipid profile is excellent, even after switching from Vytorin to simple simvastatin. Her diabetes is well controlled with a fasting blood sugar of 91. She remains mildly to moderately overweight  and expresses a desire to continue losing weight.  I encourage her to contact her physician regarding her hypercalcemia, which has recurred. She tells me that he had told her that she should have surgery, but that she declined since she did not want to go under general anesthesia again she may eventually have to have a parathyroidectomy. She has not had pathological fractures or nephrolithiasis.  Holli Humbles, MD, [place] [phone] office [phone] pager

## 2014-06-12 NOTE — Patient Instructions (Signed)
Dr. Croitoru recommends that you schedule a follow-up appointment in: One year.   

## 2014-06-17 ENCOUNTER — Other Ambulatory Visit: Payer: Self-pay | Admitting: Obstetrics and Gynecology

## 2014-06-17 DIAGNOSIS — Z1231 Encounter for screening mammogram for malignant neoplasm of breast: Secondary | ICD-10-CM

## 2014-07-06 ENCOUNTER — Other Ambulatory Visit: Payer: Self-pay | Admitting: Obstetrics and Gynecology

## 2014-07-06 ENCOUNTER — Ambulatory Visit (HOSPITAL_COMMUNITY)
Admission: RE | Admit: 2014-07-06 | Discharge: 2014-07-06 | Disposition: A | Discharge status: Home / Self Care | Payer: Medicare Other | Source: Ambulatory Visit | Attending: Obstetrics and Gynecology | Admitting: Obstetrics and Gynecology

## 2014-07-06 DIAGNOSIS — Z1231 Encounter for screening mammogram for malignant neoplasm of breast: Secondary | ICD-10-CM

## 2014-07-08 ENCOUNTER — Other Ambulatory Visit: Payer: Self-pay | Admitting: Obstetrics and Gynecology

## 2014-07-08 DIAGNOSIS — R928 Other abnormal and inconclusive findings on diagnostic imaging of breast: Secondary | ICD-10-CM

## 2014-07-15 ENCOUNTER — Ambulatory Visit
Admission: RE | Admit: 2014-07-15 | Discharge: 2014-07-15 | Disposition: A | Discharge status: Home / Self Care | Payer: Medicare Other | Source: Ambulatory Visit | Attending: Obstetrics and Gynecology | Admitting: Obstetrics and Gynecology

## 2014-07-15 DIAGNOSIS — R928 Other abnormal and inconclusive findings on diagnostic imaging of breast: Secondary | ICD-10-CM

## 2014-07-15 DIAGNOSIS — N6489 Other specified disorders of breast: Secondary | ICD-10-CM | POA: Diagnosis not present

## 2014-07-22 DIAGNOSIS — D3130 Benign neoplasm of unspecified choroid: Secondary | ICD-10-CM | POA: Diagnosis not present

## 2014-08-13 ENCOUNTER — Other Ambulatory Visit: Payer: Self-pay | Admitting: Cardiovascular Disease

## 2014-08-13 NOTE — Telephone Encounter (Signed)
Rx was sent to pharmacy electronically. 

## 2014-08-22 ENCOUNTER — Other Ambulatory Visit: Payer: Self-pay | Admitting: Cardiovascular Disease

## 2014-08-24 ENCOUNTER — Encounter: Payer: Self-pay | Admitting: Cardiovascular Disease

## 2014-08-25 ENCOUNTER — Telehealth: Payer: Self-pay | Admitting: Cardiovascular Disease

## 2014-08-25 NOTE — Telephone Encounter (Signed)
Deferred to medical records

## 2014-08-25 NOTE — Telephone Encounter (Addendum)
Pt called in stating that she would like a copy of the EKG and labs that was done on 8/21 faxed to [facility] so that she will not have to repeat these. The fax number is [phone] and telephone # [phone].  Thanks

## 2014-08-27 DIAGNOSIS — Z23 Encounter for immunization: Secondary | ICD-10-CM | POA: Diagnosis not present

## 2014-08-27 DIAGNOSIS — Z0000 Encounter for general adult medical examination without abnormal findings: Secondary | ICD-10-CM | POA: Diagnosis not present

## 2014-08-27 DIAGNOSIS — M8580 Other specified disorders of bone density and structure, unspecified site: Secondary | ICD-10-CM | POA: Diagnosis not present

## 2014-09-09 ENCOUNTER — Other Ambulatory Visit: Payer: Self-pay | Admitting: Cardiovascular Disease

## 2014-09-09 NOTE — Telephone Encounter (Signed)
Rx refill sent to patient pharmacy   

## 2014-10-08 DIAGNOSIS — M8580 Other specified disorders of bone density and structure, unspecified site: Secondary | ICD-10-CM | POA: Diagnosis not present

## 2014-10-08 DIAGNOSIS — M899 Disorder of bone, unspecified: Secondary | ICD-10-CM | POA: Diagnosis not present

## 2014-10-22 DIAGNOSIS — M8580 Other specified disorders of bone density and structure, unspecified site: Secondary | ICD-10-CM | POA: Diagnosis not present

## 2014-10-22 DIAGNOSIS — E210 Primary hyperparathyroidism: Secondary | ICD-10-CM | POA: Diagnosis not present

## 2014-11-17 ENCOUNTER — Other Ambulatory Visit: Payer: Self-pay | Admitting: Cardiovascular Disease

## 2014-11-17 NOTE — Telephone Encounter (Signed)
Rx(s) sent to pharmacy electronically.  

## 2014-12-31 ENCOUNTER — Other Ambulatory Visit: Payer: Self-pay | Admitting: Cardiovascular Disease

## 2014-12-31 NOTE — Telephone Encounter (Signed)
Rx refill sent to patient pharmacy   

## 2015-03-08 ENCOUNTER — Telehealth: Payer: Self-pay | Admitting: Cardiovascular Disease

## 2015-03-08 DIAGNOSIS — Z79899 Other long term (current) drug therapy: Secondary | ICD-10-CM

## 2015-03-08 DIAGNOSIS — E785 Hyperlipidemia, unspecified: Secondary | ICD-10-CM

## 2015-03-08 NOTE — Telephone Encounter (Signed)
Pt called in wanting some lab orders placed before she comes in to see Dr. Loletha Grayer on 9/1. Please inform pt once order has been placed.   Thanks

## 2015-03-09 NOTE — Telephone Encounter (Signed)
Patient requesting labs be done prior to next office visit.  LM that order mailed and she can have this done at her convenience - fasting.

## 2015-03-09 NOTE — Telephone Encounter (Signed)
Order mailed to patient.  LM on patients' voicemail.

## 2015-03-11 NOTE — Telephone Encounter (Signed)
Can this encounter be closed?

## 2015-05-16 ENCOUNTER — Other Ambulatory Visit: Payer: Self-pay | Admitting: Cardiovascular Disease

## 2015-06-07 ENCOUNTER — Other Ambulatory Visit: Payer: Self-pay | Admitting: Cardiovascular Disease

## 2015-06-07 NOTE — Telephone Encounter (Signed)
Rx(s) sent to pharmacy electronically. OV 06/24/15 with Dr. Sallyanne Kuster

## 2015-06-22 ENCOUNTER — Other Ambulatory Visit (HOSPITAL_COMMUNITY): Payer: Self-pay | Admitting: Obstetrics and Gynecology

## 2015-06-22 DIAGNOSIS — Z1231 Encounter for screening mammogram for malignant neoplasm of breast: Secondary | ICD-10-CM

## 2015-06-22 DIAGNOSIS — Z79899 Other long term (current) drug therapy: Secondary | ICD-10-CM | POA: Diagnosis not present

## 2015-06-22 DIAGNOSIS — E785 Hyperlipidemia, unspecified: Secondary | ICD-10-CM | POA: Diagnosis not present

## 2015-06-23 LAB — LIPID PANEL
CHOLESTEROL: 147 mg/dL (ref 125–200)
HDL: 48 mg/dL (ref >=46)
LDL Cholesterol: 77 mg/dL (ref <130)
Total CHOL/HDL Ratio: 3.1 Ratio (ref <=5.0)
Triglycerides: 110 mg/dL (ref <150)
VLDL: 22 mg/dL (ref <30)

## 2015-06-23 LAB — COMPREHENSIVE METABOLIC PANEL
ALT: 16 U/L (ref 6–29)
AST: 17 U/L (ref 10–35)
Albumin: 4.4 g/dL (ref 3.6–5.1)
Alkaline Phosphatase: 46 U/L (ref 33–130)
BUN: 24 mg/dL (ref 7–25)
CO2: 29 mmol/L (ref 20–31)
CREATININE: 0.64 mg/dL (ref 0.50–0.99)
Calcium: 10.7 mg/dL — ABNORMAL HIGH (ref 8.6–10.4)
Chloride: 104 mmol/L (ref 98–110)
Glucose, Bld: 95 mg/dL (ref 65–99)
Potassium: 4.3 mmol/L (ref 3.5–5.3)
SODIUM: 140 mmol/L (ref 135–146)
TOTAL PROTEIN: 7.1 g/dL (ref 6.1–8.1)
Total Bilirubin: 0.5 mg/dL (ref 0.2–1.2)

## 2015-06-24 ENCOUNTER — Encounter: Payer: Self-pay | Admitting: Cardiovascular Disease

## 2015-06-24 ENCOUNTER — Ambulatory Visit (INDEPENDENT_AMBULATORY_CARE_PROVIDER_SITE_OTHER): Payer: Medicare Other | Admitting: Cardiovascular Disease

## 2015-06-24 VITALS — BP 120/68 | HR 88 | Ht 62.0 in | Wt 153.0 lb

## 2015-06-24 DIAGNOSIS — I10 Essential (primary) hypertension: Secondary | ICD-10-CM

## 2015-06-24 NOTE — Progress Notes (Signed)
Patient ID: Doris [name], female   DOB: [birth year], 69 y.o.   MRN: [id number]     Cardiology Office Note   Date:  06/24/2015   ID:  Doris [name], DOB [birth year], MRN [id number]  PCP:  Gavin Pound, MD  Cardiologist:   Sanda Klein, MD   Chief Complaint  Patient presents with  . Annual Exam    Patient has no complaints.      History of Present Illness: Doris [name] is a 69 y.o. female who presents for  Follow-up for systemic hypertension, hyperlipidemia and a history of hyperglycemia. She remains overweight but has managed to lose another 3-1/2 pounds since last year. She continues to walk on a daily basis with plans to go to the gym when the weather becomes too cold. She feels great. Her blood pressure is excellent. She continues to have mild hypercalcemia.  If she does not take her low-dose of furosemide, she develops ankle swelling.    Past Medical History  Diagnosis Date  . MRSA (methicillin resistant Staphylococcus aureus) 2009    after back surg- #3 I&D surgewries after  . Hypertension   . Degenerative disc disease     C-spine, back, hip surgery  . Condyloma   . Abnormal uterine bleeding   . Dysplasia of cervix, low grade (CIN 1)   . Migraine   . Depression   . Hyperlipidemia   . Hypercalcemia     Past Surgical History  Procedure Laterality Date  . Total hip arthroplasty    . Spine surgery  2009  . Gynecologic cryosurgery    . Upper extremity venous doppler  11/14/2008    No evidence of DVT or superficial thrombosis of the right upper extremity  . Cardiovascular stress test  05/24/2011    No scintigraphic evidence of inducible myocardial ischemia. Alithough there are no reversible perfusion defects, the presence of a TID ratio >1.2 is concerning for multivessel disease.  . Cervical spine surgery       Current Outpatient Prescriptions  Medication Sig Dispense Refill  . alendronate (FOSAMAX) 70 MG tablet Take 70 mg by mouth every 7 (seven) days.    .  benazepril (LOTENSIN) 20 MG tablet TAKE 1 TABLET BY MOUTH EVERY DAY 90 tablet 0  . Cholecalciferol (VITAMIN D-3) 1000 UNITS CAPS Take 2,000 Units by mouth daily.    . furosemide (LASIX) 40 MG tablet TAKE ONE TABLET BY MOUTH ONCE DAILY 90 tablet 0  . Multiple Vitamin (MULTIVITAMIN) tablet Take 1 tablet by mouth daily.    . potassium chloride SA (K-DUR,KLOR-CON) 20 MEQ tablet TAKE 1 TABLET BY MOUTH DAILY 90 tablet 0  . simvastatin (ZOCOR) 40 MG tablet TAKE 1 TABLET BY MOUTH EVERY NIGHT AT BEDTIME 90 tablet 0   No current facility-administered medications for this visit.    Allergies:   Clindamycin/lincomycin; Codeine; Doxycycline; Hydrocodone-acetaminophen; Penicillins; Rifampin; Rocephin; and Vancomycin    Social History:  The patient  reports that she has never smoked. She has never used smokeless tobacco. She reports that she does not drink alcohol or use illicit drugs.   Family History:  The patient's family history includes COPD in her sister; Heart disease in her father and mother; Stroke in her father and mother.    ROS:  Please see the history of present illness.    Otherwise, review of systems positive for none.   All other systems are reviewed and negative.    PHYSICAL EXAM: VS:  BP 120/68 mmHg  Pulse 88  Ht 5\' 2"  (1.575 m)  Wt 153 lb (69.4 kg)  BMI 27.98 kg/m2 , BMI Body mass index is 27.98 kg/(m^2).  General: Alert, oriented x3, no distress Head: no evidence of trauma, PERRL, EOMI, no exophtalmos or lid lag, no myxedema, no xanthelasma; normal ears, nose and oropharynx Neck: normal jugular venous pulsations and no hepatojugular reflux; brisk carotid pulses without delay and no carotid bruits Chest: clear to auscultation, no signs of consolidation by percussion or palpation, normal fremitus, symmetrical and full respiratory excursions Cardiovascular: normal position and quality of the apical impulse, regular rhythm, normal first and second heart sounds, no murmurs, rubs or  gallops Abdomen: no tenderness or distention, no masses by palpation, no abnormal pulsatility or arterial bruits, normal bowel sounds, no hepatosplenomegaly Extremities: no clubbing, cyanosis or edema; 2+ radial, ulnar and brachial pulses bilaterally; 2+ right femoral, posterior tibial and dorsalis pedis pulses; 2+ left femoral, posterior tibial and dorsalis pedis pulses; no subclavian or femoral bruits Neurological: grossly nonfocal Psych: euthymic mood, full affect   EKG:  EKG is ordered today. The ekg ordered today demonstrates NSR, normal tracing   Recent Labs: 06/22/2015: ALT 16; BUN 24; Creat 0.64; Potassium 4.3; Sodium 140    Lipid Panel    Component Value Date/Time   CHOL 147 06/22/2015 0833   TRIG 110 06/22/2015 0833   HDL 48 06/22/2015 0833   CHOLHDL 3.1 06/22/2015 0833   VLDL 22 06/22/2015 0833   LDLCALC 77 06/22/2015 0833      Wt Readings from Last 3 Encounters:  06/24/15 153 lb (69.4 kg)  06/12/14 157 lb 6.4 oz (71.396 kg)  10/28/13 155 lb 3.2 oz (70.398 kg)    .   ASSESSMENT AND PLAN:  I congratulated Doris [name] on her commitment to regular exercise and weight loss. She has managed to avoid treatment for diabetes mellitus bilateral losing weight. Her blood pressure control is excellent. Her recent lipid profile is quite satisfactory. No changes are made to her medical regimen. Her hypercalcemia is very mild and is apparently secondary to hyperparathyroidism    Current medicines are reviewed at length with the patient today.  The patient does not have concerns regarding medicines.  The following changes have been made:  no change  Labs/ tests ordered today include:  Orders Placed This Encounter  Procedures  . EKG 12-Lead   Patient Instructions  Dr. Sallyanne Kuster recommends that you schedule a follow-up appointment in: ONE YEAR    SignedSanda Klein, MD  06/24/2015 11:23 AM    Sanda Klein, MD, [facility] [phone] office [phone]  pager

## 2015-06-24 NOTE — Patient Instructions (Signed)
Dr. Croitoru recommends that you schedule a follow-up appointment in: ONE YEAR   

## 2015-06-29 ENCOUNTER — Other Ambulatory Visit: Payer: Self-pay | Admitting: Cardiovascular Disease

## 2015-06-29 NOTE — Telephone Encounter (Signed)
Rx request sent to pharmacy.  

## 2015-07-19 ENCOUNTER — Ambulatory Visit (HOSPITAL_COMMUNITY): Payer: Medicare Other

## 2015-07-20 ENCOUNTER — Other Ambulatory Visit: Payer: Self-pay | Admitting: Obstetrics and Gynecology

## 2015-07-20 DIAGNOSIS — Z1231 Encounter for screening mammogram for malignant neoplasm of breast: Secondary | ICD-10-CM

## 2015-07-23 ENCOUNTER — Ambulatory Visit (HOSPITAL_COMMUNITY): Payer: Medicare Other

## 2015-08-02 ENCOUNTER — Ambulatory Visit: Payer: Medicare Other

## 2015-08-07 DIAGNOSIS — Z23 Encounter for immunization: Secondary | ICD-10-CM | POA: Diagnosis not present

## 2015-08-11 ENCOUNTER — Ambulatory Visit
Admission: RE | Admit: 2015-08-11 | Discharge: 2015-08-11 | Disposition: A | Discharge status: Home / Self Care | Payer: Medicare Other | Source: Ambulatory Visit | Attending: Obstetrics and Gynecology | Admitting: Obstetrics and Gynecology

## 2015-08-11 DIAGNOSIS — Z1231 Encounter for screening mammogram for malignant neoplasm of breast: Secondary | ICD-10-CM

## 2015-08-11 DIAGNOSIS — Z124 Encounter for screening for malignant neoplasm of cervix: Secondary | ICD-10-CM | POA: Diagnosis not present

## 2015-08-11 DIAGNOSIS — Z6828 Body mass index (BMI) 28.0-28.9, adult: Secondary | ICD-10-CM | POA: Diagnosis not present

## 2015-08-11 DIAGNOSIS — Z01419 Encounter for gynecological examination (general) (routine) without abnormal findings: Secondary | ICD-10-CM | POA: Diagnosis not present

## 2015-08-13 ENCOUNTER — Ambulatory Visit: Payer: Medicare Other

## 2015-08-13 ENCOUNTER — Other Ambulatory Visit: Payer: Self-pay | Admitting: Cardiovascular Disease

## 2015-09-03 ENCOUNTER — Other Ambulatory Visit: Payer: Self-pay | Admitting: Cardiovascular Disease

## 2015-09-07 ENCOUNTER — Ambulatory Visit: Payer: Medicare Other

## 2015-09-14 DIAGNOSIS — E785 Hyperlipidemia, unspecified: Secondary | ICD-10-CM | POA: Diagnosis not present

## 2015-09-14 DIAGNOSIS — I10 Essential (primary) hypertension: Secondary | ICD-10-CM | POA: Diagnosis not present

## 2015-09-14 DIAGNOSIS — E210 Primary hyperparathyroidism: Secondary | ICD-10-CM | POA: Diagnosis not present

## 2015-09-14 DIAGNOSIS — M8580 Other specified disorders of bone density and structure, unspecified site: Secondary | ICD-10-CM | POA: Diagnosis not present

## 2015-09-14 DIAGNOSIS — Z0000 Encounter for general adult medical examination without abnormal findings: Secondary | ICD-10-CM | POA: Diagnosis not present

## 2015-09-24 DIAGNOSIS — Z96642 Presence of left artificial hip joint: Secondary | ICD-10-CM | POA: Diagnosis not present

## 2015-09-24 DIAGNOSIS — Z471 Aftercare following joint replacement surgery: Secondary | ICD-10-CM | POA: Diagnosis not present

## 2015-12-07 DIAGNOSIS — M859 Disorder of bone density and structure, unspecified: Secondary | ICD-10-CM | POA: Diagnosis not present

## 2015-12-07 DIAGNOSIS — E210 Primary hyperparathyroidism: Secondary | ICD-10-CM | POA: Diagnosis not present

## 2016-06-02 ENCOUNTER — Other Ambulatory Visit: Payer: Self-pay | Admitting: Cardiovascular Disease

## 2016-06-02 NOTE — Telephone Encounter (Signed)
Please call the office to schedule a follow up appointment [phone].

## 2016-06-08 ENCOUNTER — Telehealth: Payer: Self-pay

## 2016-06-08 DIAGNOSIS — E785 Hyperlipidemia, unspecified: Secondary | ICD-10-CM

## 2016-06-08 DIAGNOSIS — Z79899 Other long term (current) drug therapy: Secondary | ICD-10-CM

## 2016-06-08 NOTE — Telephone Encounter (Signed)
Patient called in requesting pre-visit labs. CMP and Lipid panel ordered and will be mailed to patient. Returned call to patient. Notified her that I have placed lab slips in outgoing mail.  Patient had complained of the difficulty to reach any clinical personnel. I apologized for the inconvenience. Recommended that she attempt to utilize mychart. I offered assistance to set up mychart when she comes for her appointment in September (06/28/16 at [place]).

## 2016-06-23 ENCOUNTER — Other Ambulatory Visit: Payer: Self-pay | Admitting: Cardiovascular Disease

## 2016-06-27 DIAGNOSIS — E785 Hyperlipidemia, unspecified: Secondary | ICD-10-CM | POA: Diagnosis not present

## 2016-06-27 DIAGNOSIS — Z79899 Other long term (current) drug therapy: Secondary | ICD-10-CM | POA: Diagnosis not present

## 2016-06-28 ENCOUNTER — Ambulatory Visit (INDEPENDENT_AMBULATORY_CARE_PROVIDER_SITE_OTHER): Payer: Medicare Other | Admitting: Cardiovascular Disease

## 2016-06-28 ENCOUNTER — Encounter: Payer: Self-pay | Admitting: Cardiovascular Disease

## 2016-06-28 VITALS — BP 116/78 | HR 82 | Ht 62.0 in | Wt 160.0 lb

## 2016-06-28 DIAGNOSIS — E785 Hyperlipidemia, unspecified: Secondary | ICD-10-CM

## 2016-06-28 DIAGNOSIS — E210 Primary hyperparathyroidism: Secondary | ICD-10-CM

## 2016-06-28 DIAGNOSIS — E663 Overweight: Secondary | ICD-10-CM | POA: Diagnosis not present

## 2016-06-28 DIAGNOSIS — I10 Essential (primary) hypertension: Secondary | ICD-10-CM | POA: Diagnosis not present

## 2016-06-28 DIAGNOSIS — Z79899 Other long term (current) drug therapy: Secondary | ICD-10-CM | POA: Diagnosis not present

## 2016-06-28 LAB — LIPID PANEL
CHOLESTEROL: 164 mg/dL (ref 125–200)
HDL: 52 mg/dL (ref >=46)
LDL Cholesterol: 90 mg/dL (ref <130)
Total CHOL/HDL Ratio: 3.2 Ratio (ref <=5.0)
Triglycerides: 108 mg/dL (ref <150)
VLDL: 22 mg/dL (ref <30)

## 2016-06-28 LAB — COMPREHENSIVE METABOLIC PANEL
ALT: 31 U/L — AB (ref 6–29)
AST: 28 U/L (ref 10–35)
Albumin: 4.3 g/dL (ref 3.6–5.1)
Alkaline Phosphatase: 49 U/L (ref 33–130)
BILIRUBIN TOTAL: 0.4 mg/dL (ref 0.2–1.2)
BUN: 29 mg/dL — AB (ref 7–25)
CHLORIDE: 104 mmol/L (ref 98–110)
CO2: 26 mmol/L (ref 20–31)
CREATININE: 0.69 mg/dL (ref 0.50–0.99)
Calcium: 10.7 mg/dL — ABNORMAL HIGH (ref 8.6–10.4)
Glucose, Bld: 77 mg/dL (ref 65–99)
Potassium: 4.3 mmol/L (ref 3.5–5.3)
SODIUM: 139 mmol/L (ref 135–146)
TOTAL PROTEIN: 7.2 g/dL (ref 6.1–8.1)

## 2016-06-28 NOTE — Patient Instructions (Signed)
Dr Sallyanne Kuster recommends that you continue on your current medications as directed. Please refer to the Current Medication list given to you today.  Your physician recommends that you return for lab work in 1 year, just before your follow-up appointment. You will receive lab slips in the mail closer to time.  Dr Sallyanne Kuster recommends that you schedule a follow-up appointment in 12 months. You will receive a reminder letter in the mail two months in advance. If you don't receive a letter, please call our office to schedule the follow-up appointment.  If you need a refill on your cardiac medications before your next appointment, please call your pharmacy.

## 2016-06-28 NOTE — Progress Notes (Signed)
Cardiology Office Note    Date:  06/28/2016   ID:  Doris [name], DOB December 05, 1945, MRN [id number]  PCP:  Doris Harada, MD  Cardiologist:   Doris Klein, MD   Chief Complaint  Patient presents with  . Follow-up    History of Present Illness:  Doris [name] is a 70 y.o. female with hypertension, hyperlipidemia previous history of severe obesity (now overweight only), hyperparathyroidism returning for routine follow-up. She has been quite well since her last appointment. She enjoys taking care of her grandchildren (ages 80, 73 and 51) over the summer. She has not had issues with shortness of breath, angina, dizziness, syncope, palpitations, focal neurological complaints or intermittent claudication. He has gained back about 7 pounds since last year but is still about 20 pounds less than she was 3 years ago.  Recent labs show slightly elevated BUN with normal creatinine, mild hypercalcemia trivial abnormality in one of her liver function tests and an excellent lipid profile. Her Phillip Heal is normal.    Past Medical History:  Diagnosis Date  . Abnormal uterine bleeding   . Condyloma   . Degenerative disc disease    C-spine, back, hip surgery  . Depression   . Dysplasia of cervix, low grade (CIN 1)   . Hypercalcemia   . Hyperlipidemia   . Hypertension   . Migraine   . MRSA (methicillin resistant Staphylococcus aureus) 2009   after back surg- #3 I&D surgewries after    Past Surgical History:  Procedure Laterality Date  . CARDIOVASCULAR STRESS TEST  05/24/2011   No scintigraphic evidence of inducible myocardial ischemia. Alithough there are no reversible perfusion defects, the presence of a TID ratio >1.2 is concerning for multivessel disease.  . CERVICAL SPINE SURGERY    . GYNECOLOGIC CRYOSURGERY    . [place]  2009  . TOTAL HIP ARTHROPLASTY    . UPPER EXTREMITY VENOUS DOPPLER  11/14/2008   No evidence of DVT or superficial thrombosis of the right upper extremity     Current Medications: Outpatient Medications Prior to Visit  Medication Sig Dispense Refill  . alendronate (FOSAMAX) 70 MG tablet Take 70 mg by mouth every 7 (seven) days.    . benazepril (LOTENSIN) 20 MG tablet TAKE 1 TABLET BY MOUTH EVERY DAY 90 tablet 3  . Cholecalciferol (VITAMIN D-3) 1000 UNITS CAPS Take 2,000 Units by mouth daily.    . furosemide (LASIX) 40 MG tablet Take 1 tablet (40 mg total) by mouth daily. Please keep your upcoming appointment on 06/28/16 for additional refills. 90 tablet 0  . Multiple Vitamin (MULTIVITAMIN) tablet Take 1 tablet by mouth daily.    . potassium chloride SA (K-DUR,KLOR-CON) 20 MEQ tablet TAKE 1 TABLET BY MOUTH DAILY 90 tablet 3  . simvastatin (ZOCOR) 40 MG tablet TAKE 1 TABLET BY MOUTH EVERY NIGHT AT BEDTIME 30 tablet 0   No facility-administered medications prior to visit.      Allergies:   Vancomycin; Clindamycin/lincomycin; Codeine; Doxycycline; Hydrocodone-acetaminophen; Penicillins; Rifampin; and Rocephin [ceftriaxone sodium in dextrose]   Social History   Social History  . Marital status: Divorced    Spouse name: N/A  . Number of children: N/A  . Years of education: N/A   Social History Main Topics  . Smoking status: Never Smoker  . Smokeless tobacco: Never Used  . Alcohol use No  . Drug use: No  . Sexual activity: Not Asked   Other Topics Concern  . None   Social History Narrative  .  None     Family History:  The patient's family history includes COPD in her sister; Heart disease in her father and mother; Stroke in her father and mother.   ROS:   Please see the history of present illness.    ROS All other systems reviewed and are negative.   PHYSICAL EXAM:   VS:  BP 116/78 (BP Location: Right Arm, Patient Position: Sitting, Cuff Size: Normal)   Pulse 82   Ht 5\' 2"  (1.575 m)   Wt 160 lb (72.6 kg)   BMI 29.26 kg/m    GEN: Well nourished, well developed, in no acute distress  HEENT: normal  Neck: no JVD, carotid  bruits, or masses Cardiac: RRR; no murmurs, rubs, or gallops,no edema  Respiratory:  clear to auscultation bilaterally, normal work of breathing GI: soft, nontender, nondistended, + BS MS: no deformity or atrophy  Skin: warm and dry, no rash Neuro:  Alert and Oriented x 3, Strength and sensation are intact Psych: euthymic mood, full affect  Wt Readings from Last 3 Encounters:  06/28/16 160 lb (72.6 kg)  06/24/15 153 lb (69.4 kg)  06/12/14 157 lb 6.4 oz (71.4 kg)      Studies/Labs Reviewed:   EKG:  EKG is ordered today.  The ekg ordered today demonstrates Sinus rhythm, very minor nonspecific ST changes, QTc 418 ms.  Recent Labs: 06/27/2016: ALT 31; BUN 29; Creat 0.69; Potassium 4.3; Sodium 139   Lipid Panel    Component Value Date/Time   CHOL 164 06/27/2016 0805   TRIG 108 06/27/2016 0805   HDL 52 06/27/2016 0805   CHOLHDL 3.2 06/27/2016 0805   VLDL 22 06/27/2016 0805   LDLCALC 90 06/27/2016 0805     ASSESSMENT:    1. Dyslipidemia   2. Essential hypertension   3. Overweight (BMI 25.0-29.9)   4. Primary hyperparathyroidism ([place])   5. Medication management      PLAN:  In order of problems listed above:  1. HLP: Continue same statin regimen 2. HTN: Excellent control 3. Overweight: She was already apologizing for her weight gain and is determined to lose the extra weight that she has gained. She walks almost daily, but did slack off a little bit when the weather was very warm the summer. 4. Hyperparathyroidism: Her calcium level is minimally elevated but stable and she does not have symptoms of hypercalcemia. She is taking a bisphosphonate, loop diuretic and vitamin D supplements. She reasserts that she would rather not have any neck surgery.    Medication Adjustments/Labs and Tests Ordered: Current medicines are reviewed at length with the patient today.  Concerns regarding medicines are outlined above.  Medication changes, Labs and Tests ordered today are listed in  the Patient Instructions below. Patient Instructions  Dr Doris [name] recommends that you continue on your current medications as directed. Please refer to the Current Medication list given to you today.  Your physician recommends that you return for lab work in 1 year, just before your follow-up appointment. You will receive lab slips in the mail closer to time.  Dr Doris [name] recommends that you schedule a follow-up appointment in 12 months. You will receive a reminder letter in the mail two months in advance. If you don't receive a letter, please call our office to schedule the follow-up appointment.  If you need a refill on your cardiac medications before your next appointment, please call your pharmacy.    Signed, Doris Klein, MD  06/28/2016 1:19 PM    [facility] [place], [place], [place]  [address] Phone: [phone]; Fax: [phone]

## 2016-07-01 ENCOUNTER — Other Ambulatory Visit: Payer: Self-pay | Admitting: Cardiovascular Disease

## 2016-07-03 NOTE — Telephone Encounter (Signed)
Rx(s) sent to pharmacy electronically.  

## 2016-07-14 DIAGNOSIS — Z23 Encounter for immunization: Secondary | ICD-10-CM | POA: Diagnosis not present

## 2016-07-21 ENCOUNTER — Other Ambulatory Visit: Payer: Self-pay | Admitting: Family Medicine

## 2016-07-21 DIAGNOSIS — Z1231 Encounter for screening mammogram for malignant neoplasm of breast: Secondary | ICD-10-CM

## 2016-08-11 ENCOUNTER — Other Ambulatory Visit: Payer: Self-pay | Admitting: *Deleted

## 2016-08-11 ENCOUNTER — Other Ambulatory Visit: Payer: Self-pay | Admitting: Cardiovascular Disease

## 2016-08-11 MED ORDER — BENAZEPRIL HCL 20 MG PO TABS: 20.0000 mg | ORAL_TABLET | Freq: Every day | ORAL | 3 refills | Status: DC

## 2016-08-11 MED ORDER — POTASSIUM CHLORIDE CRYS ER 20 MEQ PO TBCR: 20.0000 meq | EXTENDED_RELEASE_TABLET | Freq: Every day | ORAL | 3 refills | Status: DC

## 2016-08-14 ENCOUNTER — Ambulatory Visit: Payer: Medicare Other

## 2016-10-01 ENCOUNTER — Other Ambulatory Visit: Payer: Self-pay | Admitting: Cardiovascular Disease

## 2016-10-02 ENCOUNTER — Ambulatory Visit
Admission: RE | Admit: 2016-10-02 | Discharge: 2016-10-02 | Disposition: A | Discharge status: Home / Self Care | Payer: Medicare Other | Source: Ambulatory Visit | Attending: Family Medicine | Admitting: Family Medicine

## 2016-10-02 DIAGNOSIS — Z1231 Encounter for screening mammogram for malignant neoplasm of breast: Secondary | ICD-10-CM | POA: Diagnosis not present

## 2016-12-11 DIAGNOSIS — Z5181 Encounter for therapeutic drug level monitoring: Secondary | ICD-10-CM | POA: Diagnosis not present

## 2016-12-11 DIAGNOSIS — M8580 Other specified disorders of bone density and structure, unspecified site: Secondary | ICD-10-CM | POA: Diagnosis not present

## 2016-12-11 DIAGNOSIS — R635 Abnormal weight gain: Secondary | ICD-10-CM | POA: Diagnosis not present

## 2016-12-11 DIAGNOSIS — E210 Primary hyperparathyroidism: Secondary | ICD-10-CM | POA: Diagnosis not present

## 2016-12-22 DIAGNOSIS — Z0000 Encounter for general adult medical examination without abnormal findings: Secondary | ICD-10-CM | POA: Diagnosis not present

## 2016-12-22 DIAGNOSIS — E785 Hyperlipidemia, unspecified: Secondary | ICD-10-CM | POA: Diagnosis not present

## 2016-12-22 DIAGNOSIS — I10 Essential (primary) hypertension: Secondary | ICD-10-CM | POA: Diagnosis not present

## 2016-12-22 DIAGNOSIS — E210 Primary hyperparathyroidism: Secondary | ICD-10-CM | POA: Diagnosis not present

## 2016-12-22 DIAGNOSIS — M8580 Other specified disorders of bone density and structure, unspecified site: Secondary | ICD-10-CM | POA: Diagnosis not present

## 2016-12-25 DIAGNOSIS — E210 Primary hyperparathyroidism: Secondary | ICD-10-CM | POA: Diagnosis not present

## 2017-04-02 ENCOUNTER — Other Ambulatory Visit: Payer: Self-pay | Admitting: Cardiovascular Disease

## 2017-06-07 ENCOUNTER — Telehealth: Payer: Self-pay | Admitting: Cardiovascular Disease

## 2017-06-07 DIAGNOSIS — Z79899 Other long term (current) drug therapy: Secondary | ICD-10-CM

## 2017-06-07 NOTE — Telephone Encounter (Signed)
New message      Calling to ask the nurse if she needs to have labs drawn prior to sept ov.  Please call or leave msg on cell phone vm

## 2017-06-08 NOTE — Telephone Encounter (Signed)
Left a message that the patient should come in for fasting labs before her appointment in September. She has been informed to call if she has any questions.

## 2017-06-30 ENCOUNTER — Other Ambulatory Visit: Payer: Self-pay | Admitting: Cardiovascular Disease

## 2017-07-02 NOTE — Telephone Encounter (Signed)
Rx(s) sent to pharmacy electronically.  

## 2017-07-13 DIAGNOSIS — Z79899 Other long term (current) drug therapy: Secondary | ICD-10-CM | POA: Diagnosis not present

## 2017-07-13 LAB — COMPREHENSIVE METABOLIC PANEL
ALT: 20 IU/L (ref 0–32)
AST: 23 IU/L (ref 0–40)
Albumin/Globulin Ratio: 1.8 (ref 1.2–2.2)
Albumin: 4.9 g/dL — ABNORMAL HIGH (ref 3.6–4.8)
Alkaline Phosphatase: 56 IU/L (ref 39–117)
BUN/Creatinine Ratio: 31 — ABNORMAL HIGH (ref 12–28)
BUN: 18 mg/dL (ref 8–27)
Bilirubin Total: 0.4 mg/dL (ref 0.0–1.2)
CALCIUM: 11.3 mg/dL — AB (ref 8.7–10.3)
CO2: 22 mmol/L (ref 20–29)
CREATININE: 0.58 mg/dL (ref 0.57–1.00)
Chloride: 102 mmol/L (ref 96–106)
GFR calc Af Amer: 109 mL/min/{1.73_m2} (ref >59)
GFR, EST NON AFRICAN AMERICAN: 94 mL/min/{1.73_m2} (ref >59)
Globulin, Total: 2.8 g/dL (ref 1.5–4.5)
Glucose: 101 mg/dL — ABNORMAL HIGH (ref 65–99)
Potassium: 4 mmol/L (ref 3.5–5.2)
Sodium: 141 mmol/L (ref 134–144)
TOTAL PROTEIN: 7.7 g/dL (ref 6.0–8.5)

## 2017-07-13 LAB — LIPID PANEL
CHOL/HDL RATIO: 3 ratio (ref 0.0–4.4)
Cholesterol, Total: 148 mg/dL (ref 100–199)
HDL: 49 mg/dL (ref >39)
LDL Calculated: 78 mg/dL (ref 0–99)
TRIGLYCERIDES: 106 mg/dL (ref 0–149)
VLDL Cholesterol Cal: 21 mg/dL (ref 5–40)

## 2017-07-16 ENCOUNTER — Ambulatory Visit (INDEPENDENT_AMBULATORY_CARE_PROVIDER_SITE_OTHER): Payer: Medicare Other | Admitting: Cardiovascular Disease

## 2017-07-16 ENCOUNTER — Encounter: Payer: Self-pay | Admitting: Cardiovascular Disease

## 2017-07-16 VITALS — BP 140/82 | HR 93 | Ht 62.0 in | Wt 163.0 lb

## 2017-07-16 DIAGNOSIS — E7800 Pure hypercholesterolemia, unspecified: Secondary | ICD-10-CM

## 2017-07-16 DIAGNOSIS — I10 Essential (primary) hypertension: Secondary | ICD-10-CM | POA: Diagnosis not present

## 2017-07-16 DIAGNOSIS — E210 Primary hyperparathyroidism: Secondary | ICD-10-CM

## 2017-07-16 DIAGNOSIS — E663 Overweight: Secondary | ICD-10-CM

## 2017-07-16 NOTE — Patient Instructions (Signed)
Dr Croitoru recommends that you schedule a follow-up appointment in 12 months. You will receive a reminder letter in the mail two months in advance. If you don't receive a letter, please call our office to schedule the follow-up appointment.  If you need a refill on your cardiac medications before your next appointment, please call your pharmacy. 

## 2017-07-16 NOTE — Progress Notes (Signed)
Cardiology Office Note    Date:  07/16/2017   ID:  Doris [name], DOB 18-Aug-1946, MRN [id number]  PCP:  Vernie Shanks, MD  Cardiologist:   Sanda Klein, MD   Chief Complaint  Patient presents with  . Follow-up    History of Present Illness:  Doris [name] is a 71 y.o. female with hypertension, hyperlipidemia previous history of severe obesity (now overweight only), hyperparathyroidism returning for routine follow-up.   She is very apologetic about gaining 3 pounds since her last visit a year ago. She has had problems with back pain and leg pain. Her ability to exercise as been limited. She continues to try to eat a healthy diet. She has to wake up every morning at 4:30 AM to help with care for her 81 year old granddaughter, due to her son's shift work. She had some trouble with urinary leakage when taking the higher dose of furosemide, improved after she cut back to 20 mg daily. She has not had issues with edema or dyspnea  The patient specifically denies any chest pain at rest exertion, dyspnea at rest or with exertion, orthopnea, paroxysmal nocturnal dyspnea, syncope, palpitations, focal neurological deficits, intermittent claudication, lower extremity edema, unexplained weight gain, cough, hemoptysis or wheezing.  Recent lipid profile shows an LDL of 78, all other lipid parameters in acceptable range. Typical blood pressure at home is around 128/78.    Past Medical History:  Diagnosis Date  . Abnormal uterine bleeding   . Condyloma   . Degenerative disc disease    C-spine, back, hip surgery  . Depression   . Dysplasia of cervix, low grade (CIN 1)   . Hypercalcemia   . Hyperlipidemia   . Hypertension   . Migraine   . MRSA (methicillin resistant Staphylococcus aureus) 2009   after back surg- #3 I&D surgewries after    Past Surgical History:  Procedure Laterality Date  . CARDIOVASCULAR STRESS TEST  05/24/2011   No scintigraphic evidence of inducible myocardial  ischemia. Alithough there are no reversible perfusion defects, the presence of a TID ratio >1.2 is concerning for multivessel disease.  . CERVICAL SPINE SURGERY    . GYNECOLOGIC CRYOSURGERY    . [place]  2009  . TOTAL HIP ARTHROPLASTY    . UPPER EXTREMITY VENOUS DOPPLER  11/14/2008   No evidence of DVT or superficial thrombosis of the right upper extremity    Current Medications: Outpatient Medications Prior to Visit  Medication Sig Dispense Refill  . alendronate (FOSAMAX) 70 MG tablet Take 70 mg by mouth every 7 (seven) days.    Marland Kitchen aspirin EC 81 MG tablet Take 81 mg by mouth daily.    . benazepril (LOTENSIN) 20 MG tablet Take 1 tablet (20 mg total) by mouth daily. 90 tablet 3  . Cholecalciferol (VITAMIN D-3) 1000 UNITS CAPS Take 2,000 Units by mouth daily.    . Multiple Vitamin (MULTIVITAMIN) tablet Take 1 tablet by mouth daily.    . potassium chloride SA (K-DUR,KLOR-CON) 20 MEQ tablet Take 1 tablet (20 mEq total) by mouth daily. 90 tablet 3  . furosemide (LASIX) 40 MG tablet TAKE 1 TABLET BY MOUTH EVERY DAY. PLEASE KEEP UPCOMING APPOINTMENT (Patient not taking: Reported on 07/16/2017) 90 tablet 3  . simvastatin (ZOCOR) 40 MG tablet TAKE 1 TABLET BY MOUTH EVERYDAY AT BEDTIME (Patient not taking: Reported on 07/16/2017) 90 tablet 1   No facility-administered medications prior to visit.      Allergies:   Vancomycin; Hydrocodone-acetaminophen; Penicillins; Rifampin;  Rocephin [ceftriaxone sodium in dextrose]; Clindamycin/lincomycin; Codeine; and Doxycycline   Social History   Social History  . Marital status: Divorced    Spouse name: N/A  . Number of children: N/A  . Years of education: N/A   Social History Main Topics  . Smoking status: Never Smoker  . Smokeless tobacco: Never Used  . Alcohol use No  . Drug use: No  . Sexual activity: Not on file   Other Topics Concern  . Not on file   Social History Narrative  . No narrative on file     Family History:  The patient's  family history includes COPD in her sister; Heart disease in her father and mother; Stroke in her father and mother.   ROS:   Please see the history of present illness.    ROS All other systems reviewed and are negative.   PHYSICAL EXAM:   VS:  BP 140/82   Pulse 93   Ht 5\' 2"  (1.575 m)   Wt 163 lb (73.9 kg)   BMI 29.81 kg/m    Recheck blood pressure 137/76 GEN: Well nourished, well developed, in no acute distress  HEENT: normal  Neck: no JVD, carotid bruits, or masses Cardiac: RRR; no murmurs, rubs, or gallops,no edema  Respiratory:  clear to auscultation bilaterally, normal work of breathing GI: soft, nontender, nondistended, + BS MS: no deformity or atrophy  Skin: warm and dry, no rash Neuro:  Alert and Oriented x 3, Strength and sensation are intact Psych: euthymic mood, full affect  Wt Readings from Last 3 Encounters:  07/16/17 163 lb (73.9 kg)  06/28/16 160 lb (72.6 kg)  06/24/15 153 lb (69.4 kg)      Studies/Labs Reviewed:   EKG:  EKG is ordered today.  The ekg ordered today demonstrates Sinus rhythm with a single PVC, otherwise normal tracing  Recent Labs: 07/13/2017: ALT 20; BUN 18; Creatinine, Ser 0.58; Potassium 4.0; Sodium 141   Lipid Panel    Component Value Date/Time   CHOL 148 07/13/2017 0908   TRIG 106 07/13/2017 0908   HDL 49 07/13/2017 0908   CHOLHDL 3.0 07/13/2017 0908   CHOLHDL 3.2 06/27/2016 0805   VLDL 22 06/27/2016 0805   LDLCALC 78 07/13/2017 0908     ASSESSMENT:    1. Pure hypercholesterolemia   2. Essential hypertension   3. Overweight (BMI 25.0-29.9)   4. Hyperparathyroidism, primary ([place])      PLAN:  In order of problems listed above:  1. HLP: Continue same simvastatin regimen. She will call us back to make sure we have the exact dose she is taking. 2. HTN: Good control 3. Overweight: Encouraged her to keep eating a healthy diet and exercising. Even if she is not successful in weight loss, this will improve her overall  prognosis. 4. Hyperparathyroidism: Prefers not to have parathyroid surgery. Keeping calcium under 11. From my point of view, okay to decrease the dose of diuretic, taking it intermittently, not every day.    Medication Adjustments/Labs and Tests Ordered: Current medicines are reviewed at length with the patient today.  Concerns regarding medicines are outlined above.  Medication changes, Labs and Tests ordered today are listed in the Patient Instructions below. Patient Instructions  Dr Sallyanne Kuster recommends that you schedule a follow-up appointment in 12 months. You will receive a reminder letter in the mail two months in advance. If you don't receive a letter, please call our office to schedule the follow-up appointment.  If you need a refill  on your cardiac medications before your next appointment, please call your pharmacy.    Signed, Sanda Klein, MD  07/16/2017 1:31 PM    [facility] [place], [place], [place]  [address] Phone: [phone]; Fax: [phone]

## 2017-07-19 ENCOUNTER — Telehealth: Payer: Self-pay | Admitting: Cardiovascular Disease

## 2017-07-19 NOTE — Telephone Encounter (Signed)
New Message ° ° pt verbalized that she is returning call for rn °

## 2017-07-24 ENCOUNTER — Other Ambulatory Visit: Payer: Self-pay | Admitting: Obstetrics and Gynecology

## 2017-07-24 DIAGNOSIS — Z1231 Encounter for screening mammogram for malignant neoplasm of breast: Secondary | ICD-10-CM

## 2017-08-13 DIAGNOSIS — Z6831 Body mass index (BMI) 31.0-31.9, adult: Secondary | ICD-10-CM | POA: Diagnosis not present

## 2017-08-13 DIAGNOSIS — E213 Hyperparathyroidism, unspecified: Secondary | ICD-10-CM | POA: Diagnosis not present

## 2017-08-13 DIAGNOSIS — Z01419 Encounter for gynecological examination (general) (routine) without abnormal findings: Secondary | ICD-10-CM | POA: Diagnosis not present

## 2017-08-17 NOTE — Telephone Encounter (Signed)
Detailed letter mailed to patient. 

## 2017-08-21 DIAGNOSIS — Z23 Encounter for immunization: Secondary | ICD-10-CM | POA: Diagnosis not present

## 2017-08-25 ENCOUNTER — Other Ambulatory Visit: Payer: Self-pay | Admitting: Cardiovascular Disease

## 2017-08-26 ENCOUNTER — Other Ambulatory Visit: Payer: Self-pay | Admitting: Cardiovascular Disease

## 2017-08-27 NOTE — Telephone Encounter (Signed)
REFILL 

## 2017-10-01 ENCOUNTER — Other Ambulatory Visit: Payer: Self-pay | Admitting: Cardiovascular Disease

## 2017-10-03 ENCOUNTER — Ambulatory Visit: Payer: Medicare Other

## 2017-10-04 ENCOUNTER — Ambulatory Visit: Payer: Medicare Other

## 2017-10-08 ENCOUNTER — Ambulatory Visit
Admission: RE | Admit: 2017-10-08 | Discharge: 2017-10-08 | Disposition: A | Discharge status: Home / Self Care | Payer: Medicare Other | Source: Ambulatory Visit | Attending: Obstetrics and Gynecology | Admitting: Obstetrics and Gynecology

## 2017-10-08 DIAGNOSIS — Z1231 Encounter for screening mammogram for malignant neoplasm of breast: Secondary | ICD-10-CM | POA: Diagnosis not present

## 2017-11-05 ENCOUNTER — Ambulatory Visit: Payer: Medicare Other

## 2017-12-17 DIAGNOSIS — Z5181 Encounter for therapeutic drug level monitoring: Secondary | ICD-10-CM | POA: Diagnosis not present

## 2017-12-17 DIAGNOSIS — R7303 Prediabetes: Secondary | ICD-10-CM | POA: Diagnosis not present

## 2017-12-17 DIAGNOSIS — R635 Abnormal weight gain: Secondary | ICD-10-CM | POA: Diagnosis not present

## 2017-12-17 DIAGNOSIS — E210 Primary hyperparathyroidism: Secondary | ICD-10-CM | POA: Diagnosis not present

## 2017-12-17 DIAGNOSIS — M8580 Other specified disorders of bone density and structure, unspecified site: Secondary | ICD-10-CM | POA: Diagnosis not present

## 2017-12-24 DIAGNOSIS — L821 Other seborrheic keratosis: Secondary | ICD-10-CM | POA: Diagnosis not present

## 2017-12-24 DIAGNOSIS — E210 Primary hyperparathyroidism: Secondary | ICD-10-CM | POA: Diagnosis not present

## 2017-12-24 DIAGNOSIS — L812 Freckles: Secondary | ICD-10-CM | POA: Diagnosis not present

## 2017-12-24 DIAGNOSIS — M8580 Other specified disorders of bone density and structure, unspecified site: Secondary | ICD-10-CM | POA: Diagnosis not present

## 2017-12-24 DIAGNOSIS — E785 Hyperlipidemia, unspecified: Secondary | ICD-10-CM | POA: Diagnosis not present

## 2017-12-24 DIAGNOSIS — I10 Essential (primary) hypertension: Secondary | ICD-10-CM | POA: Diagnosis not present

## 2017-12-24 DIAGNOSIS — Z0000 Encounter for general adult medical examination without abnormal findings: Secondary | ICD-10-CM | POA: Diagnosis not present

## 2018-01-08 ENCOUNTER — Other Ambulatory Visit: Payer: Self-pay | Admitting: Cardiovascular Disease

## 2018-04-22 DIAGNOSIS — H2513 Age-related nuclear cataract, bilateral: Secondary | ICD-10-CM | POA: Diagnosis not present

## 2018-04-30 DIAGNOSIS — E210 Primary hyperparathyroidism: Secondary | ICD-10-CM | POA: Diagnosis not present

## 2018-04-30 DIAGNOSIS — Z780 Asymptomatic menopausal state: Secondary | ICD-10-CM | POA: Diagnosis not present

## 2018-04-30 DIAGNOSIS — M8588 Other specified disorders of bone density and structure, other site: Secondary | ICD-10-CM | POA: Diagnosis not present

## 2018-05-19 ENCOUNTER — Other Ambulatory Visit: Payer: Self-pay | Admitting: Cardiovascular Disease

## 2018-05-21 NOTE — Telephone Encounter (Signed)
Rx request sent to pharmacy.  

## 2018-07-19 ENCOUNTER — Other Ambulatory Visit: Payer: Self-pay | Admitting: Obstetrics and Gynecology

## 2018-07-19 DIAGNOSIS — Z1231 Encounter for screening mammogram for malignant neoplasm of breast: Secondary | ICD-10-CM

## 2018-08-02 DIAGNOSIS — Z23 Encounter for immunization: Secondary | ICD-10-CM | POA: Diagnosis not present

## 2018-08-07 ENCOUNTER — Other Ambulatory Visit: Payer: Self-pay | Admitting: Cardiovascular Disease

## 2018-08-12 ENCOUNTER — Telehealth: Payer: Self-pay | Admitting: Cardiovascular Disease

## 2018-08-12 DIAGNOSIS — E7800 Pure hypercholesterolemia, unspecified: Secondary | ICD-10-CM

## 2018-08-12 DIAGNOSIS — I10 Essential (primary) hypertension: Secondary | ICD-10-CM

## 2018-08-12 DIAGNOSIS — Z79899 Other long term (current) drug therapy: Secondary | ICD-10-CM

## 2018-08-12 NOTE — Telephone Encounter (Signed)
Labs order lipid,cmp

## 2018-08-12 NOTE — Telephone Encounter (Signed)
New Message   Patient is aware that she normal have lipids done prior to her appt. Not showing a current order but she is aware that she can come in anytime fasting for the labs. She just wants to make sure that the order is in and there is no callback needed.

## 2018-09-01 ENCOUNTER — Other Ambulatory Visit: Payer: Self-pay | Admitting: Cardiovascular Disease

## 2018-09-12 DIAGNOSIS — R309 Painful micturition, unspecified: Secondary | ICD-10-CM | POA: Diagnosis not present

## 2018-09-12 DIAGNOSIS — Z6831 Body mass index (BMI) 31.0-31.9, adult: Secondary | ICD-10-CM | POA: Diagnosis not present

## 2018-09-12 DIAGNOSIS — Z01419 Encounter for gynecological examination (general) (routine) without abnormal findings: Secondary | ICD-10-CM | POA: Diagnosis not present

## 2018-10-01 DIAGNOSIS — I10 Essential (primary) hypertension: Secondary | ICD-10-CM | POA: Diagnosis not present

## 2018-10-01 DIAGNOSIS — E7800 Pure hypercholesterolemia, unspecified: Secondary | ICD-10-CM | POA: Diagnosis not present

## 2018-10-01 DIAGNOSIS — Z79899 Other long term (current) drug therapy: Secondary | ICD-10-CM | POA: Diagnosis not present

## 2018-10-01 LAB — LIPID PANEL
CHOL/HDL RATIO: 3.8 ratio (ref 0.0–4.4)
Cholesterol, Total: 182 mg/dL (ref 100–199)
HDL: 48 mg/dL (ref >39)
LDL CALC: 106 mg/dL — AB (ref 0–99)
Triglycerides: 141 mg/dL (ref 0–149)
VLDL Cholesterol Cal: 28 mg/dL (ref 5–40)

## 2018-10-01 LAB — COMPREHENSIVE METABOLIC PANEL
ALBUMIN: 4.5 g/dL (ref 3.5–4.8)
ALT: 20 IU/L (ref 0–32)
AST: 15 IU/L (ref 0–40)
Albumin/Globulin Ratio: 1.8 (ref 1.2–2.2)
Alkaline Phosphatase: 61 IU/L (ref 39–117)
BUN / CREAT RATIO: 30 — AB (ref 12–28)
BUN: 18 mg/dL (ref 8–27)
Bilirubin Total: 0.4 mg/dL (ref 0.0–1.2)
CALCIUM: 11.4 mg/dL — AB (ref 8.7–10.3)
CO2: 23 mmol/L (ref 20–29)
Chloride: 102 mmol/L (ref 96–106)
Creatinine, Ser: 0.6 mg/dL (ref 0.57–1.00)
GFR calc non Af Amer: 92 mL/min/{1.73_m2} (ref >59)
GFR, EST AFRICAN AMERICAN: 106 mL/min/{1.73_m2} (ref >59)
GLUCOSE: 97 mg/dL (ref 65–99)
Globulin, Total: 2.5 g/dL (ref 1.5–4.5)
Potassium: 4.6 mmol/L (ref 3.5–5.2)
Sodium: 139 mmol/L (ref 134–144)
TOTAL PROTEIN: 7 g/dL (ref 6.0–8.5)

## 2018-10-02 ENCOUNTER — Other Ambulatory Visit: Payer: Self-pay | Admitting: Cardiovascular Disease

## 2018-10-03 ENCOUNTER — Encounter: Payer: Self-pay | Admitting: Cardiovascular Disease

## 2018-10-03 ENCOUNTER — Ambulatory Visit (INDEPENDENT_AMBULATORY_CARE_PROVIDER_SITE_OTHER): Payer: Medicare Other | Admitting: Cardiovascular Disease

## 2018-10-03 ENCOUNTER — Encounter

## 2018-10-03 VITALS — BP 150/80 | HR 94 | Ht 62.0 in | Wt 170.0 lb

## 2018-10-03 DIAGNOSIS — Z79899 Other long term (current) drug therapy: Secondary | ICD-10-CM | POA: Diagnosis not present

## 2018-10-03 DIAGNOSIS — E210 Primary hyperparathyroidism: Secondary | ICD-10-CM | POA: Diagnosis not present

## 2018-10-03 DIAGNOSIS — E669 Obesity, unspecified: Secondary | ICD-10-CM | POA: Diagnosis not present

## 2018-10-03 DIAGNOSIS — E7800 Pure hypercholesterolemia, unspecified: Secondary | ICD-10-CM | POA: Diagnosis not present

## 2018-10-03 DIAGNOSIS — I10 Essential (primary) hypertension: Secondary | ICD-10-CM

## 2018-10-03 NOTE — Patient Instructions (Signed)
Medication Instructions:  Dr Sallyanne Kuster recommends that you continue on your current medications as directed. Please refer to the Current Medication list given to you today.  If you need a refill on your cardiac medications before your next appointment, please call your pharmacy.   Lab work: Your physician recommends that you return for lab work in 1 year - FASTING.  If you have labs (blood work) drawn today and your tests are completely normal, you will receive your results only by: Marland Kitchen MyChart Message (if you have MyChart) OR . A paper copy in the mail If you have any lab test that is abnormal or we need to change your treatment, we will call you to review the results.  Follow-Up: At [facility], you and your health needs are our priority.  As part of our continuing mission to provide you with exceptional heart care, we have created designated Provider Care Teams.  These Care Teams include your primary Cardiologist (physician) and Advanced Practice Providers (APPs -  Physician Assistants and Nurse Practitioners) who all work together to provide you with the care you need, when you need it. You will need a follow up appointment in 12 months. You may see No primary care provider on file. or one of the following Advanced Practice Providers on your designated Care Team: Almyra Deforest, [place] . Fabian Sharp, PA-C . You will receive a reminder letter in the mail two months in advance. If you don't receive a letter, please call our office to schedule the follow-up appointment.

## 2018-10-03 NOTE — Progress Notes (Signed)
Cardiology Office Note    Date:  10/04/2018   ID:  Doris [name], DOB 09-05-1946, MRN [id number]  PCP:  Vernie Shanks, MD  Cardiologist:   Sanda Klein, MD   Chief Complaint  Patient presents with  . Follow-up    12 months.    History of Present Illness:  Doris [name] is a 72 y.o. female with hypertension, hyperlipidemia previous history of severe obesity (now overweight only), hyperparathyroidism returning for routine follow-up.   She is very apologetic about weight gain.  She has been heavily involved with the care of her grandchildren and has not had time for exercise.  She continues to have issues with pain in her back and knees.  She often will grab a quick meal that is rich in carbs.  She has gained weight and this is reflected in a higher LDL cholesterol level.  Her blood pressure is elevated today, but at home she reports that she checks it frequently and it is consistently in the 127-132/70s.  She thinks that she is anxious today because she knew what be upset that she gained weight.  She continues to have mild hypercalcemia, stable at around 11.4.  The patient specifically denies any chest pain at rest exertion, dyspnea at rest or with exertion, orthopnea, paroxysmal nocturnal dyspnea, syncope, palpitations, focal neurological deficits, intermittent claudication, lower extremity edema, unexplained weight gain, cough, hemoptysis or wheezing.  The patient specifically denies any chest pain at rest exertion, dyspnea at rest or with exertion, orthopnea, paroxysmal nocturnal dyspnea, syncope, palpitations, focal neurological deficits, intermittent claudication, lower extremity edema, unexplained weight gain, cough, hemoptysis or wheezing.   Past Medical History:  Diagnosis Date  . Abnormal uterine bleeding   . Condyloma   . Degenerative disc disease    C-spine, back, hip surgery  . Depression   . Dysplasia of cervix, low grade (CIN 1)   . Hypercalcemia   .  Hyperlipidemia   . Hypertension   . Migraine   . MRSA (methicillin resistant Staphylococcus aureus) 2009   after back surg- #3 I&D surgewries after    Past Surgical History:  Procedure Laterality Date  . CARDIOVASCULAR STRESS TEST  05/24/2011   No scintigraphic evidence of inducible myocardial ischemia. Alithough there are no reversible perfusion defects, the presence of a TID ratio >1.2 is concerning for multivessel disease.  . CERVICAL SPINE SURGERY    . GYNECOLOGIC CRYOSURGERY    . [place]  2009  . TOTAL HIP ARTHROPLASTY    . UPPER EXTREMITY VENOUS DOPPLER  11/14/2008   No evidence of DVT or superficial thrombosis of the right upper extremity    Current Medications: Outpatient Medications Prior to Visit  Medication Sig Dispense Refill  . alendronate (FOSAMAX) 70 MG tablet Take 70 mg by mouth every 7 (seven) days.    Marland Kitchen aspirin EC 81 MG tablet Take 81 mg by mouth daily.    . benazepril (LOTENSIN) 20 MG tablet TAKE ONE (1) TABLET (20 MG) BY MOUTH DAILY 90 tablet 2  . Cholecalciferol (VITAMIN D-3) 1000 UNITS CAPS Take 2,000 Units by mouth daily.    . furosemide (LASIX) 40 MG tablet TAKE 0.5 TABLETS BY MOUTH DAILY. 45 tablet 0  . Multiple Vitamin (MULTIVITAMIN) tablet Take 1 tablet by mouth daily.    . potassium chloride SA (KLOR-CON M20) 20 MEQ tablet Take 1 tablet (20 mEq total) by mouth daily. KEEP OV. 90 tablet 0  . simvastatin (ZOCOR) 40 MG tablet Take 20 mg by  mouth daily.    . simvastatin (ZOCOR) 40 MG tablet TAKE 1 TABLET BY MOUTH EVERYDAY AT BEDTIME 90 tablet 2   No facility-administered medications prior to visit.      Allergies:   Vancomycin; Hydrocodone-acetaminophen; Penicillins; Rifampin; Rocephin [ceftriaxone sodium in dextrose]; Clindamycin/lincomycin; Codeine; and Doxycycline   Social History   Socioeconomic History  . Marital status: Divorced    Spouse name: Not on file  . Number of children: Not on file  . Years of education: Not on file  . Highest  education level: Not on file  Occupational History  . Not on file  Social Needs  . Financial resource strain: Not on file  . Food insecurity:    Worry: Not on file    Inability: Not on file  . Transportation needs:    Medical: Not on file    Non-medical: Not on file  Tobacco Use  . Smoking status: Never Smoker  . Smokeless tobacco: Never Used  Substance and Sexual Activity  . Alcohol use: No  . Drug use: No  . Sexual activity: Not on file  Lifestyle  . Physical activity:    Days per week: Not on file    Minutes per session: Not on file  . Stress: Not on file  Relationships  . Social connections:    Talks on phone: Not on file    Gets together: Not on file    Attends religious service: Not on file    Active member of club or organization: Not on file    Attends meetings of clubs or organizations: Not on file    Relationship status: Not on file  Other Topics Concern  . Not on file  Social History Narrative  . Not on file     Family History:  The patient's family history includes COPD in her sister; Heart disease in her father and mother; Stroke in her father and mother.   ROS:   Please see the history of present illness.    ROS All other systems reviewed and are negative.   PHYSICAL EXAM:   VS:  BP (!) 150/80 (BP Location: Left Arm, Patient Position: Sitting, Cuff Size: Normal)   Pulse 94   Ht 5\' 2"  (1.575 m)   Wt 170 lb (77.1 kg)   BMI 31.09 kg/m     General: Alert, oriented x3, no distress, mildly obese Head: no evidence of trauma, PERRL, EOMI, no exophtalmos or lid lag, no myxedema, no xanthelasma; normal ears, nose and oropharynx Neck: normal jugular venous pulsations and no hepatojugular reflux; brisk carotid pulses without delay and no carotid bruits Chest: clear to auscultation, no signs of consolidation by percussion or palpation, normal fremitus, symmetrical and full respiratory excursions Cardiovascular: normal position and quality of the apical  impulse, regular rhythm, normal first and second heart sounds, no murmurs, rubs or gallops Abdomen: no tenderness or distention, no masses by palpation, no abnormal pulsatility or arterial bruits, normal bowel sounds, no hepatosplenomegaly Extremities: no clubbing, cyanosis or edema; 2+ radial, ulnar and brachial pulses bilaterally; 2+ right femoral, posterior tibial and dorsalis pedis pulses; 2+ left femoral, posterior tibial and dorsalis pedis pulses; no subclavian or femoral bruits Neurological: grossly nonfocal Psych: Normal mood and affect   Wt Readings from Last 3 Encounters:  10/03/18 170 lb (77.1 kg)  07/16/17 163 lb (73.9 kg)  06/28/16 160 lb (72.6 kg)      Studies/Labs Reviewed:   EKG:  EKG is ordered today.  The ekg ordered today  demonstrates sinus rhythm, sharp Q waves in the inferior leads, no repolarization abnormalities, normal QTC 440 ms Recent Labs: 10/01/2018: ALT 20; BUN 18; Creatinine, Ser 0.60; Potassium 4.6; Sodium 139   Lipid Panel    Component Value Date/Time   CHOL 182 10/01/2018 1030   TRIG 141 10/01/2018 1030   HDL 48 10/01/2018 1030   CHOLHDL 3.8 10/01/2018 1030   CHOLHDL 3.2 06/27/2016 0805   VLDL 22 06/27/2016 0805   LDLCALC 106 (H) 10/01/2018 1030     ASSESSMENT:    1. Pure hypercholesterolemia   2. Essential hypertension   3. Mild obesity   4. Hyperparathyroidism, primary ([place])   5. Medication management      PLAN:  In order of problems listed above:  1. HLP: Her lipid levels have worsened due to inactivity and weight gain.  She is just slightly above the target LDL of 100 and I did not change her medications, but encouraged further attention to her own health. 2. HTN: Reports good control typically.  No changes made to her blood pressure medications.  Asked her to call me if her systolic blood pressures consistently above 188 or diastolic over 80. 3. Obesity: Needs to focus on eating a healthy diet and exercising  regularly 4. Hyperparathyroidism: Hypercalcemia is mild and asymptomatic.  Prefers not to have parathyroid surgery.  Avoid thiazides.  Will be following up with Dr. Buddy Duty.  She is aware of the fact that untreated hyperparathyroidism can lead to brittle bones.    Medication Adjustments/Labs and Tests Ordered: Current medicines are reviewed at length with the patient today.  Concerns regarding medicines are outlined above.  Medication changes, Labs and Tests ordered today are listed in the Patient Instructions below. Patient Instructions  Medication Instructions:  Dr Sallyanne Kuster recommends that you continue on your current medications as directed. Please refer to the Current Medication list given to you today.  If you need a refill on your cardiac medications before your next appointment, please call your pharmacy.   Lab work: Your physician recommends that you return for lab work in 1 year - FASTING.  If you have labs (blood work) drawn today and your tests are completely normal, you will receive your results only by: Marland Kitchen MyChart Message (if you have MyChart) OR . A paper copy in the mail If you have any lab test that is abnormal or we need to change your treatment, we will call you to review the results.  Follow-Up: At [facility], you and your health needs are our priority.  As part of our continuing mission to provide you with exceptional heart care, we have created designated Provider Care Teams.  These Care Teams include your primary Cardiologist (physician) and Advanced Practice Providers (APPs -  Physician Assistants and Nurse Practitioners) who all work together to provide you with the care you need, when you need it. You will need a follow up appointment in 12 months. You may see No primary care provider on file. or one of the following Advanced Practice Providers on your designated Care Team: Almyra Deforest, [place] . Fabian Sharp, PA-C . You will receive a reminder letter in the mail two  months in advance. If you don't receive a letter, please call our office to schedule the follow-up appointment.    Signed, Sanda Klein, MD  10/04/2018 4:12 PM    [facility] [address], [place], [place]  [address] Phone: [phone]; Fax: [phone]

## 2018-10-09 ENCOUNTER — Ambulatory Visit
Admission: RE | Admit: 2018-10-09 | Discharge: 2018-10-09 | Disposition: A | Discharge status: Home / Self Care | Payer: Medicare Other | Source: Ambulatory Visit | Attending: Obstetrics and Gynecology | Admitting: Obstetrics and Gynecology

## 2018-10-09 DIAGNOSIS — Z1231 Encounter for screening mammogram for malignant neoplasm of breast: Secondary | ICD-10-CM | POA: Diagnosis not present

## 2018-10-22 ENCOUNTER — Telehealth: Payer: Self-pay | Admitting: Cardiovascular Disease

## 2018-10-22 ENCOUNTER — Other Ambulatory Visit: Payer: Self-pay

## 2018-10-22 MED ORDER — SIMVASTATIN 40 MG PO TABS: 40.0000 mg | ORAL_TABLET | Freq: Every day | ORAL | 3 refills | Status: DC

## 2018-10-22 NOTE — Telephone Encounter (Signed)
Refill sent to pharmacy.   

## 2018-10-22 NOTE — Telephone Encounter (Signed)
New Message    *STAT* If patient is at the pharmacy, call can be transferred to refill team.   1. Which medications need to be refilled? (please list name of each medication and dose if known)  simvastatin (ZOCOR) 40 MG tablet  2. Which pharmacy/location (including street and city if local pharmacy) is medication to be sent to? [facility]   3. Do they need a 30 day or 90 day supply? 90 days

## 2018-10-22 NOTE — Telephone Encounter (Signed)
In MD note, the med list has both simvastatin 40mg  QD and 1/2 (20mg ) QD listed. This medication was not refilled at last visit on 12/12. Routed to CMA to clarify with MD on correct dose - simvastatin 40 or 20mg ?

## 2018-10-22 NOTE — Telephone Encounter (Signed)
Opened in error. No longer needed.

## 2018-10-22 NOTE — Telephone Encounter (Signed)
New Message:  Patient calling concerning a refill her medication RX number [id number] Simvastatin Patient only has 3 pills left and would like to get this done today, because her patient insurance runs out today. Please call patient.

## 2018-11-29 ENCOUNTER — Other Ambulatory Visit: Payer: Self-pay | Admitting: Cardiovascular Disease

## 2018-12-30 DIAGNOSIS — M8580 Other specified disorders of bone density and structure, unspecified site: Secondary | ICD-10-CM | POA: Diagnosis not present

## 2018-12-30 DIAGNOSIS — M170 Bilateral primary osteoarthritis of knee: Secondary | ICD-10-CM | POA: Diagnosis not present

## 2018-12-30 DIAGNOSIS — I10 Essential (primary) hypertension: Secondary | ICD-10-CM | POA: Diagnosis not present

## 2018-12-30 DIAGNOSIS — M545 Low back pain: Secondary | ICD-10-CM | POA: Diagnosis not present

## 2018-12-30 DIAGNOSIS — E785 Hyperlipidemia, unspecified: Secondary | ICD-10-CM | POA: Diagnosis not present

## 2018-12-30 DIAGNOSIS — E210 Primary hyperparathyroidism: Secondary | ICD-10-CM | POA: Diagnosis not present

## 2018-12-30 DIAGNOSIS — L812 Freckles: Secondary | ICD-10-CM | POA: Diagnosis not present

## 2018-12-30 DIAGNOSIS — Z0000 Encounter for general adult medical examination without abnormal findings: Secondary | ICD-10-CM | POA: Diagnosis not present

## 2019-01-25 ENCOUNTER — Other Ambulatory Visit: Payer: Self-pay | Admitting: Cardiovascular Disease

## 2019-01-27 ENCOUNTER — Other Ambulatory Visit: Payer: Self-pay | Admitting: Cardiovascular Disease

## 2019-01-27 DIAGNOSIS — M8580 Other specified disorders of bone density and structure, unspecified site: Secondary | ICD-10-CM | POA: Diagnosis not present

## 2019-01-27 DIAGNOSIS — E210 Primary hyperparathyroidism: Secondary | ICD-10-CM | POA: Diagnosis not present

## 2019-01-27 DIAGNOSIS — R7303 Prediabetes: Secondary | ICD-10-CM | POA: Diagnosis not present

## 2019-01-27 MED ORDER — FUROSEMIDE 20 MG PO TABS: 20.0000 mg | ORAL_TABLET | Freq: Every day | ORAL | 1 refills | Status: DC

## 2019-01-27 NOTE — Telephone Encounter (Signed)
Furosemide refilled. Patient takes 20 mg daily. Pill change to 20 mg tablet from 40 mg tablet.

## 2019-02-04 ENCOUNTER — Other Ambulatory Visit: Payer: Self-pay

## 2019-02-04 MED ORDER — FUROSEMIDE 40 MG PO TABS: 40.0000 mg | ORAL_TABLET | Freq: Every day | ORAL | 1 refills | Status: DC

## 2019-02-04 NOTE — Telephone Encounter (Signed)
That's fine with me. Order the 40 mg tabs.  #90, 1 refill should last her 1 year It's probably for cost concerns. MCr

## 2019-02-22 ENCOUNTER — Other Ambulatory Visit: Payer: Self-pay | Admitting: Cardiovascular Disease

## 2019-02-22 NOTE — Telephone Encounter (Signed)
Refilled Benazepril and Klor-con.

## 2019-05-19 ENCOUNTER — Other Ambulatory Visit: Payer: Self-pay | Admitting: Cardiovascular Disease

## 2019-07-14 DIAGNOSIS — Z23 Encounter for immunization: Secondary | ICD-10-CM | POA: Diagnosis not present

## 2019-08-10 ENCOUNTER — Other Ambulatory Visit: Payer: Self-pay | Admitting: Cardiovascular Disease

## 2019-08-12 NOTE — Telephone Encounter (Signed)
Rx request sent to pharmacy.  

## 2019-09-01 ENCOUNTER — Other Ambulatory Visit: Payer: Self-pay | Admitting: Obstetrics and Gynecology

## 2019-09-01 DIAGNOSIS — Z1231 Encounter for screening mammogram for malignant neoplasm of breast: Secondary | ICD-10-CM

## 2019-09-22 ENCOUNTER — Other Ambulatory Visit: Payer: Self-pay | Admitting: Cardiovascular Disease

## 2019-10-08 ENCOUNTER — Telehealth: Payer: Medicare Other | Admitting: Cardiovascular Disease

## 2019-10-13 ENCOUNTER — Ambulatory Visit
Admission: RE | Admit: 2019-10-13 | Discharge: 2019-10-13 | Disposition: A | Discharge status: Home / Self Care | Payer: Medicare Other | Source: Ambulatory Visit | Attending: Obstetrics and Gynecology | Admitting: Obstetrics and Gynecology

## 2019-10-13 ENCOUNTER — Other Ambulatory Visit: Payer: Self-pay

## 2019-10-13 DIAGNOSIS — Z1231 Encounter for screening mammogram for malignant neoplasm of breast: Secondary | ICD-10-CM

## 2019-12-01 ENCOUNTER — Ambulatory Visit: Payer: Medicare Other

## 2019-12-05 ENCOUNTER — Ambulatory Visit: Payer: Medicare Other | Attending: Internal Medicine

## 2019-12-05 DIAGNOSIS — Z23 Encounter for immunization: Secondary | ICD-10-CM | POA: Insufficient documentation

## 2019-12-05 NOTE — Progress Notes (Signed)
   Covid-19 Vaccination Clinic  Name:  Doris [name]    MRN: [id number] DOB: 22-May-1946  12/05/2019  [name] was observed post Covid-19 immunization for 30 minutes based on pre-vaccination screening without incidence. She was provided with Vaccine Information Sheet and instruction to access the V-Safe system.   [name] was instructed to call 911 with any severe reactions post vaccine: Marland Kitchen Difficulty breathing  . Swelling of your face and throat  . A fast heartbeat  . A bad rash all over your body  . Dizziness and weakness    Immunizations Administered    Name Date Dose VIS Date Route   Pfizer COVID-19 Vaccine 12/05/2019  1:26 PM 0.3 mL 10/03/2019 Intramuscular   Manufacturer: [place]   Lot: [id number]   [place]: [id number]

## 2019-12-08 ENCOUNTER — Other Ambulatory Visit: Payer: Self-pay

## 2019-12-08 DIAGNOSIS — Z79899 Other long term (current) drug therapy: Secondary | ICD-10-CM

## 2019-12-08 DIAGNOSIS — E7800 Pure hypercholesterolemia, unspecified: Secondary | ICD-10-CM | POA: Diagnosis not present

## 2019-12-09 ENCOUNTER — Encounter: Payer: Self-pay | Admitting: Cardiovascular Disease

## 2019-12-09 ENCOUNTER — Other Ambulatory Visit: Payer: Self-pay

## 2019-12-09 ENCOUNTER — Ambulatory Visit (INDEPENDENT_AMBULATORY_CARE_PROVIDER_SITE_OTHER): Payer: Medicare Other | Admitting: Cardiovascular Disease

## 2019-12-09 VITALS — BP 130/70 | HR 103 | Ht 62.0 in | Wt 171.6 lb

## 2019-12-09 DIAGNOSIS — E210 Primary hyperparathyroidism: Secondary | ICD-10-CM | POA: Diagnosis not present

## 2019-12-09 DIAGNOSIS — E669 Obesity, unspecified: Secondary | ICD-10-CM

## 2019-12-09 DIAGNOSIS — I10 Essential (primary) hypertension: Secondary | ICD-10-CM

## 2019-12-09 DIAGNOSIS — E7800 Pure hypercholesterolemia, unspecified: Secondary | ICD-10-CM | POA: Diagnosis not present

## 2019-12-09 LAB — COMPREHENSIVE METABOLIC PANEL
ALT: 20 IU/L (ref 0–32)
AST: 16 IU/L (ref 0–40)
Albumin/Globulin Ratio: 1.6 (ref 1.2–2.2)
Albumin: 4.5 g/dL (ref 3.7–4.7)
Alkaline Phosphatase: 71 IU/L (ref 39–117)
BUN/Creatinine Ratio: 21 (ref 12–28)
BUN: 13 mg/dL (ref 8–27)
Bilirubin Total: 0.4 mg/dL (ref 0.0–1.2)
CO2: 22 mmol/L (ref 20–29)
Calcium: 11.7 mg/dL — ABNORMAL HIGH (ref 8.7–10.3)
Chloride: 107 mmol/L — ABNORMAL HIGH (ref 96–106)
Creatinine, Ser: 0.61 mg/dL (ref 0.57–1.00)
GFR calc Af Amer: 105 mL/min/{1.73_m2} (ref >59)
GFR calc non Af Amer: 91 mL/min/{1.73_m2} (ref >59)
Globulin, Total: 2.9 g/dL (ref 1.5–4.5)
Glucose: 105 mg/dL — ABNORMAL HIGH (ref 65–99)
Potassium: 4.9 mmol/L (ref 3.5–5.2)
Sodium: 144 mmol/L (ref 134–144)
Total Protein: 7.4 g/dL (ref 6.0–8.5)

## 2019-12-09 LAB — LIPID PANEL
Chol/HDL Ratio: 3.6 ratio (ref 0.0–4.4)
Cholesterol, Total: 150 mg/dL (ref 100–199)
HDL: 42 mg/dL (ref >39)
LDL Chol Calc (NIH): 83 mg/dL (ref 0–99)
Triglycerides: 144 mg/dL (ref 0–149)
VLDL Cholesterol Cal: 25 mg/dL (ref 5–40)

## 2019-12-09 NOTE — Patient Instructions (Signed)
Medication Instructions:  No changes *If you need a refill on your cardiac medications before your next appointment, please call your pharmacy*  Lab Work: Your provider would like for you to have the following labs before your 12 month follow up: Lipid and CMET  If you have labs (blood work) drawn today and your tests are completely normal, you will receive your results only by: Marland Kitchen MyChart Message (if you have MyChart) OR . A paper copy in the mail If you have any lab test that is abnormal or we need to change your treatment, we will call you to review the results.  Testing/Procedures: None ordered  Follow-Up: At [facility], you and your health needs are our priority.  As part of our continuing mission to provide you with exceptional heart care, we have created designated Provider Care Teams.  These Care Teams include your primary Cardiologist (physician) and Advanced Practice Providers (APPs -  Physician Assistants and Nurse Practitioners) who all work together to provide you with the care you need, when you need it.  Your next appointment:   12 month(s)  The format for your next appointment:   In Person  Provider:   You may see Sanda Klein, MD or one of the following Advanced Practice Providers on your designated Care Team:    Almyra Deforest, PA-C  Fabian Sharp, PA-C or   Roby Lofts, [place]

## 2019-12-09 NOTE — Progress Notes (Signed)
Cardiology Office Note    Date:  12/11/2019   ID:  Doris [name], DOB 06-Oct-1946, MRN [id number]  PCP:  Vernie Shanks, MD  Cardiologist:   Sanda Klein, MD   Chief Complaint  Patient presents with   Hyperlipidemia    History of Present Illness:  Doris [name] is a 74 y.o. female with hypertension, hyperlipidemia previous history of severe obesity (now overweight only), hyperparathyroidism returning for routine follow-up.   Since last year, she is very apologetic about her inability to lose weight.  She is heavily invested in helping to care for her grandchildren and her physical activity has been limited by pain in her right hip (she is seeing Dr. Alvan Dame and is considering surgery).  Her BMI places her in the mildly obese range.  She denies any cardiovascular complaints.  The patient specifically denies any chest pain at rest exertion, dyspnea at rest or with exertion, orthopnea, paroxysmal nocturnal dyspnea, syncope, palpitations, focal neurological deficits, intermittent claudication, lower extremity edema, unexplained weight gain, cough, hemoptysis or wheezing.   She takes furosemide for hypercalcemia, not for hypertension or heart failure.  She is very embarrassed because she has frequent problems with urinary leakage.   Past Medical History:  Diagnosis Date   Abnormal uterine bleeding    Condyloma    Degenerative disc disease    C-spine, back, hip surgery   Depression    Dysplasia of cervix, low grade (CIN 1)    Hypercalcemia    Hyperlipidemia    Hypertension    Migraine    MRSA (methicillin resistant Staphylococcus aureus) 2009   after back surg- #3 I&D surgewries after    Past Surgical History:  Procedure Laterality Date   CARDIOVASCULAR STRESS TEST  05/24/2011   No scintigraphic evidence of inducible myocardial ischemia. Alithough there are no reversible perfusion defects, the presence of a TID ratio >1.2 is concerning for multivessel disease.    CERVICAL SPINE SURGERY     GYNECOLOGIC CRYOSURGERY     SPINE SURGERY  2009   TOTAL HIP ARTHROPLASTY     UPPER EXTREMITY VENOUS DOPPLER  11/14/2008   No evidence of DVT or superficial thrombosis of the right upper extremity    Current Medications: Outpatient Medications Prior to Visit  Medication Sig Dispense Refill   alendronate (FOSAMAX) 70 MG tablet Take 70 mg by mouth every 7 (seven) days.     aspirin EC 81 MG tablet Take 81 mg by mouth daily.     benazepril (LOTENSIN) 20 MG tablet TAKE 1 TABLET BY MOUTH EVERY DAY 90 tablet 0   Cholecalciferol (VITAMIN D-3) 1000 UNITS CAPS Take 2,000 Units by mouth daily.     furosemide (LASIX) 20 MG tablet Take 20 mg by mouth daily.      KLOR-CON M20 20 MEQ tablet TAKE 1 TABLET BY MOUTH EVERY DAY 90 tablet 0   Multiple Vitamin (MULTIVITAMIN) tablet Take 1 tablet by mouth daily.     simvastatin (ZOCOR) 40 MG tablet Take 1 tablet (40 mg total) by mouth at bedtime. 90 tablet 3   furosemide (LASIX) 20 MG tablet Take 1 tablet (20 mg total) by mouth daily. (Patient taking differently: Take 10 mg by mouth daily. ) 90 tablet 1   furosemide (LASIX) 40 MG tablet Take 1 tablet (40 mg total) by mouth daily. 90 tablet 1   No facility-administered medications prior to visit.     Allergies:   Vancomycin, Hydrocodone-acetaminophen, Penicillins, Rifampin, Rocephin [ceftriaxone sodium in dextrose], Clindamycin/lincomycin, Codeine,  and Doxycycline   Social History   Socioeconomic History   Marital status: Divorced    Spouse name: Not on file   Number of children: Not on file   Years of education: Not on file   Highest education level: Not on file  Occupational History   Not on file  Tobacco Use   Smoking status: Never Smoker   Smokeless tobacco: Never Used  Substance and Sexual Activity   Alcohol use: No   Drug use: No   Sexual activity: Not on file  Other Topics Concern   Not on file  Social History Narrative   Not on  file   Social Determinants of Health   Financial Resource Strain:    Difficulty of Paying Living Expenses: Not on file  Food Insecurity:    Worried About [place] in the Last Year: Not on file   Ran Out of Food in the Last Year: Not on file  Transportation Needs:    Lack of Transportation (Medical): Not on file   Lack of Transportation (Non-Medical): Not on file  Physical Activity:    Days of Exercise per Week: Not on file   Minutes of Exercise per Session: Not on file  Stress:    Feeling of Stress : Not on file  Social Connections:    Frequency of Communication with Friends and Family: Not on file   Frequency of Social Gatherings with Friends and Family: Not on file   Attends Religious Services: Not on file   Active Member of Clubs or Organizations: Not on file   Attends Archivist Meetings: Not on file   Marital Status: Not on file     Family History:  The patient's family history includes COPD in her sister; Heart disease in her father and mother; Stroke in her father and mother.   ROS:   Please see the history of present illness.    ROS All other systems reviewed and are negative.   PHYSICAL EXAM:   VS:  BP 130/70    Pulse (!) 103    Ht 5\' 2"  (1.575 m)    Wt 171 lb 9.6 oz (77.8 kg)    BMI 31.39 kg/m      General: Alert, oriented x3, no distress, mildly obese Head: no evidence of trauma, PERRL, EOMI, no exophtalmos or lid lag, no myxedema, no xanthelasma; normal ears, nose and oropharynx Neck: normal jugular venous pulsations and no hepatojugular reflux; brisk carotid pulses without delay and no carotid bruits Chest: clear to auscultation, no signs of consolidation by percussion or palpation, normal fremitus, symmetrical and full respiratory excursions Cardiovascular: normal position and quality of the apical impulse, regular rhythm, normal first and second heart sounds, no murmurs, rubs or gallops Abdomen: no tenderness or distention,  no masses by palpation, no abnormal pulsatility or arterial bruits, normal bowel sounds, no hepatosplenomegaly Extremities: no clubbing, cyanosis or edema; 2+ radial, ulnar and brachial pulses bilaterally; 2+ right femoral, posterior tibial and dorsalis pedis pulses; 2+ left femoral, posterior tibial and dorsalis pedis pulses; no subclavian or femoral bruits Neurological: grossly nonfocal Psych: Normal mood and affect  Wt Readings from Last 3 Encounters:  12/09/19 171 lb 9.6 oz (77.8 kg)      Studies/Labs Reviewed:   EKG:  EKG is ordered today.  Her EKG shows mild sinus tachycardia, pre-existing very sharp inferior Q waves, otherwise normal tracing without repolarization abnormalities. Recent Labs: 12/08/2019: ALT 20; BUN 13; Creatinine, Ser 0.61; Potassium 4.9; Sodium  144   Lipid Panel    Component Value Date/Time   CHOL 150 12/08/2019 0955   TRIG 144 12/08/2019 0955   HDL 42 12/08/2019 0955   CHOLHDL 3.6 12/08/2019 0955   CHOLHDL 3.2 06/27/2016 0805   VLDL 22 06/27/2016 0805   LDLCALC 83 12/08/2019 0955     ASSESSMENT:    1. Pure hypercholesterolemia   2. Essential hypertension   3. Mild obesity   4. Hyperparathyroidism, primary ([place])      PLAN:  In order of problems listed above:  1. HLP: Her lipid profile has improved since last year.  All parameters are within target range, albeit with a borderline HDL.  No change in medications.  Encourage her to continue pursuing a healthy diet and exercise to try to lose weight. 2. HTN: Well-controlled. 3. Obesity: Exercise limited by hip pain, needs to focus more on restricting calorie, carbohydrate and saturated fat intake. 4. Hyperparathyroidism: Trying to manage with loop diuretics rather than parathyroid surgery, but the diuretics are increasing her problems with her bladder and incontinence accidents.  Avoid thiazides.  Will be following up with Dr. Buddy Duty.  Calcium is a little higher this time around at 11.7 and she might have  to reconsider.    Medication Adjustments/Labs and Tests Ordered: Current medicines are reviewed at length with the patient today.  Concerns regarding medicines are outlined above.  Medication changes, Labs and Tests ordered today are listed in the Patient Instructions below. Patient Instructions  Medication Instructions:  No changes *If you need a refill on your cardiac medications before your next appointment, please call your pharmacy*  Lab Work: Your provider would like for you to have the following labs before your 12 month follow up: Lipid and CMET  If you have labs (blood work) drawn today and your tests are completely normal, you will receive your results only by:  [place] (if you have MyChart) OR  A paper copy in the mail If you have any lab test that is abnormal or we need to change your treatment, we will call you to review the results.  Testing/Procedures: None ordered  Follow-Up: At [facility], you and your health needs are our priority.  As part of our continuing mission to provide you with exceptional heart care, we have created designated Provider Care Teams.  These Care Teams include your primary Cardiologist (physician) and Advanced Practice Providers (APPs -  Physician Assistants and Nurse Practitioners) who all work together to provide you with the care you need, when you need it.  Your next appointment:   12 month(s)  The format for your next appointment:   In Person  Provider:   You may see Sanda Klein, MD or one of the following Advanced Practice Providers on your designated Care Team:    Almyra Deforest, PA-C  Fabian Sharp, [place] or   Roby Lofts, PA-C     Signed, Sanda Klein, MD  12/11/2019 10:38 AM    [address], [place],   [address] Phone: [phone]; Fax: [phone]

## 2019-12-11 ENCOUNTER — Encounter: Payer: Self-pay | Admitting: Cardiovascular Disease

## 2019-12-28 ENCOUNTER — Ambulatory Visit: Payer: Medicare Other | Attending: Internal Medicine

## 2019-12-28 DIAGNOSIS — Z23 Encounter for immunization: Secondary | ICD-10-CM

## 2019-12-28 NOTE — Progress Notes (Signed)
   Covid-19 Vaccination Clinic  Name:  Doris [name]    MRN: [id number] DOB: 10-20-1946  12/28/2019  Ms. Gentz was observed post Covid-19 immunization for 30 minutes based on pre-vaccination screening without incident. She was provided with Vaccine Information Sheet and instruction to access the V-Safe system.   Ms. Poppy was instructed to call 911 with any severe reactions post vaccine: Marland Kitchen Difficulty breathing  . Swelling of face and throat  . A fast heartbeat  . A bad rash all over body  . Dizziness and weakness   Immunizations Administered    Name Date Dose VIS Date Route   Pfizer COVID-19 Vaccine 12/28/2019 10:02 AM 0.3 mL 10/03/2019 Intramuscular   Manufacturer: [place]   Lot: [id number]   [place]: [id number]

## 2020-01-01 DIAGNOSIS — M170 Bilateral primary osteoarthritis of knee: Secondary | ICD-10-CM | POA: Diagnosis not present

## 2020-01-01 DIAGNOSIS — M8580 Other specified disorders of bone density and structure, unspecified site: Secondary | ICD-10-CM | POA: Diagnosis not present

## 2020-01-01 DIAGNOSIS — Z0000 Encounter for general adult medical examination without abnormal findings: Secondary | ICD-10-CM | POA: Diagnosis not present

## 2020-01-01 DIAGNOSIS — E210 Primary hyperparathyroidism: Secondary | ICD-10-CM | POA: Diagnosis not present

## 2020-01-01 DIAGNOSIS — E785 Hyperlipidemia, unspecified: Secondary | ICD-10-CM | POA: Diagnosis not present

## 2020-01-01 DIAGNOSIS — N200 Calculus of kidney: Secondary | ICD-10-CM | POA: Diagnosis not present

## 2020-01-01 DIAGNOSIS — R350 Frequency of micturition: Secondary | ICD-10-CM | POA: Diagnosis not present

## 2020-01-01 DIAGNOSIS — I10 Essential (primary) hypertension: Secondary | ICD-10-CM | POA: Diagnosis not present

## 2020-01-01 DIAGNOSIS — M545 Low back pain: Secondary | ICD-10-CM | POA: Diagnosis not present

## 2020-01-27 ENCOUNTER — Other Ambulatory Visit: Payer: Self-pay | Admitting: Cardiovascular Disease

## 2020-02-16 DIAGNOSIS — M8580 Other specified disorders of bone density and structure, unspecified site: Secondary | ICD-10-CM | POA: Diagnosis not present

## 2020-02-16 DIAGNOSIS — R7303 Prediabetes: Secondary | ICD-10-CM | POA: Diagnosis not present

## 2020-02-16 DIAGNOSIS — E210 Primary hyperparathyroidism: Secondary | ICD-10-CM | POA: Diagnosis not present

## 2020-02-25 ENCOUNTER — Other Ambulatory Visit: Payer: Self-pay | Admitting: Cardiovascular Disease

## 2020-02-25 NOTE — Telephone Encounter (Signed)
Left a message for the patient to call back to clarify the Furosemide dosage.

## 2020-02-25 NOTE — Telephone Encounter (Signed)
*  STAT* If patient is at the pharmacy, call can be transferred to refill team.   1. Which medications need to be refilled? (please list name of each medication and dose if known)  Would you please call today-  need medicine today- been trying to get itlay -Simvastatin, Furosemide, Klor-Con and Benazepril  2. Which pharmacy/location (including street and city if local pharmacy) is medication to be sent to?[facility], [place]  3. Do they need a 30 day or 90 day supply? 90 days and refills

## 2020-02-26 ENCOUNTER — Other Ambulatory Visit: Payer: Self-pay | Admitting: Cardiovascular Disease

## 2020-02-26 MED ORDER — SIMVASTATIN 40 MG PO TABS: 40.0000 mg | ORAL_TABLET | Freq: Every day | ORAL | 3 refills | Status: DC

## 2020-02-26 NOTE — Telephone Encounter (Signed)
Patient called  Back/ patient very upset , that  Her medication has not been refilled ans sent to pharmacy.   She states she and pharmacy has been trying x4   Rn  Apologized, and reviwed currte refill request - Furosemide 20 mg daily . Patient states Dr Sallyanne Kuster  Decrease dosage at previous visit.   Benazepril mg daily  Klor- con 20 meq daily  Simvastatin 40 mg daily.   refilled with #90 tablets x 3 refills  RN e-sent Rx to pharmacy.

## 2020-02-26 NOTE — Telephone Encounter (Signed)
This is Dr. Croitoru's pt 

## 2020-03-01 DIAGNOSIS — R7309 Other abnormal glucose: Secondary | ICD-10-CM | POA: Diagnosis not present

## 2020-03-01 DIAGNOSIS — R7301 Impaired fasting glucose: Secondary | ICD-10-CM | POA: Diagnosis not present

## 2020-03-18 DIAGNOSIS — M8580 Other specified disorders of bone density and structure, unspecified site: Secondary | ICD-10-CM | POA: Diagnosis not present

## 2020-03-18 DIAGNOSIS — E210 Primary hyperparathyroidism: Secondary | ICD-10-CM | POA: Diagnosis not present

## 2020-03-18 DIAGNOSIS — I10 Essential (primary) hypertension: Secondary | ICD-10-CM | POA: Diagnosis not present

## 2020-03-18 DIAGNOSIS — E1165 Type 2 diabetes mellitus with hyperglycemia: Secondary | ICD-10-CM | POA: Diagnosis not present

## 2020-04-08 ENCOUNTER — Other Ambulatory Visit: Payer: Self-pay | Admitting: Podiatry

## 2020-04-08 ENCOUNTER — Ambulatory Visit (INDEPENDENT_AMBULATORY_CARE_PROVIDER_SITE_OTHER): Payer: Medicare Other | Admitting: Podiatry

## 2020-04-08 ENCOUNTER — Ambulatory Visit (INDEPENDENT_AMBULATORY_CARE_PROVIDER_SITE_OTHER): Payer: Medicare Other

## 2020-04-08 ENCOUNTER — Encounter: Payer: Self-pay | Admitting: Podiatry

## 2020-04-08 ENCOUNTER — Other Ambulatory Visit: Payer: Self-pay

## 2020-04-08 VITALS — BP 137/85 | HR 96 | Resp 16

## 2020-04-08 DIAGNOSIS — E1142 Type 2 diabetes mellitus with diabetic polyneuropathy: Secondary | ICD-10-CM

## 2020-04-08 DIAGNOSIS — E119 Type 2 diabetes mellitus without complications: Secondary | ICD-10-CM | POA: Diagnosis not present

## 2020-04-08 DIAGNOSIS — A630 Anogenital (venereal) warts: Secondary | ICD-10-CM | POA: Insufficient documentation

## 2020-04-08 DIAGNOSIS — R87619 Unspecified abnormal cytological findings in specimens from cervix uteri: Secondary | ICD-10-CM | POA: Insufficient documentation

## 2020-04-08 DIAGNOSIS — G43909 Migraine, unspecified, not intractable, without status migrainosus: Secondary | ICD-10-CM | POA: Insufficient documentation

## 2020-04-08 DIAGNOSIS — F32A Depression, unspecified: Secondary | ICD-10-CM | POA: Insufficient documentation

## 2020-04-10 NOTE — Progress Notes (Signed)
Subjective:  Patient ID: Doris [name], female    DOB: February 09, 1946,  MRN: [id number] HPI Chief Complaint  Patient presents with  . Diabetes    Foot Exam - PCP recommended, last a1c was 7.3, no complaints  . New Patient (Initial Visit)    74 y.o. female presents with the above complaint.   ROS: Denies fever chills nausea vomiting muscle aches pains calf pain back pain chest pain shortness of breath.  Past Medical History:  Diagnosis Date  . Abnormal uterine bleeding   . Condyloma   . Degenerative disc disease    C-spine, back, hip surgery  . Depression   . Dysplasia of cervix, low grade (CIN 1)   . Hypercalcemia   . Hyperlipidemia   . Hypertension   . Migraine   . MRSA (methicillin resistant Staphylococcus aureus) 2009   after back surg- #3 I&D surgewries after   Past Surgical History:  Procedure Laterality Date  . CARDIOVASCULAR STRESS TEST  05/24/2011   No scintigraphic evidence of inducible myocardial ischemia. Alithough there are no reversible perfusion defects, the presence of a TID ratio >1.2 is concerning for multivessel disease.  . CERVICAL SPINE SURGERY    . GYNECOLOGIC CRYOSURGERY    . [place]  2009  . TOTAL HIP ARTHROPLASTY    . UPPER EXTREMITY VENOUS DOPPLER  11/14/2008   No evidence of DVT or superficial thrombosis of the right upper extremity    Current Outpatient Medications:  .  alendronate (FOSAMAX) 70 MG tablet, Take 70 mg by mouth every 7 (seven) days., Disp: , Rfl:  .  aspirin EC 81 MG tablet, Take 81 mg by mouth daily., Disp: , Rfl:  .  benazepril (LOTENSIN) 20 MG tablet, TAKE 1 TABLET BY MOUTH EVERY DAY, Disp: 90 tablet, Rfl: 3 .  Cholecalciferol (VITAMIN D-3) 1000 UNITS CAPS, Take 2,000 Units by mouth daily., Disp: , Rfl:  .  furosemide (LASIX) 20 MG tablet, Take 1 tablet (20 mg total) by mouth daily., Disp: 90 tablet, Rfl: 3 .  KLOR-CON M20 20 MEQ tablet, TAKE 1 TABLET BY MOUTH EVERY DAY, Disp: 90 tablet, Rfl: 3 .  Multiple Vitamin  (MULTIVITAMIN) tablet, Take 1 tablet by mouth daily., Disp: , Rfl:  .  simvastatin (ZOCOR) 40 MG tablet, Take 1 tablet (40 mg total) by mouth at bedtime., Disp: 90 tablet, Rfl: 3  Allergies  Allergen Reactions  . Vancomycin Anaphylaxis and Swelling    REACTION: severe reaction  . Hydrocodone-Acetaminophen     REACTION: becomes very sick  . Penicillins     REACTION: rash  . Rifampin     REACTION: rash  . Rocephin [Ceftriaxone Sodium In Dextrose]   . Clindamycin/Lincomycin Swelling  . Codeine Nausea Only    REACTION: upset stomach  . Doxycycline Swelling   Review of Systems Objective:   Vitals:   04/08/20 1021  BP: 137/85  Pulse: 96  Resp: 16    General: Well developed, nourished, in no acute distress, alert and oriented x3   Dermatological: Skin is warm, dry and supple bilateral. Nails x 10 are well maintained; remaining integument appears unremarkable at this time. There are no open sores, no preulcerative lesions, no rash or signs of infection present.  Vascular: Dorsalis Pedis artery and Posterior Tibial artery pedal pulses are 2/4 bilateral with immedate capillary fill time. Pedal hair growth present. No varicosities and no lower extremity edema present bilateral.   Neruologic: Grossly intact via light touch bilateral. Vibratory intact via tuning fork bilateral.  Protective threshold with Semmes Wienstein monofilament intact to all pedal sites bilateral. Patellar and Achilles deep tendon reflexes 2+ bilateral. No Babinski or clonus noted bilateral.   Musculoskeletal: No gross boney pedal deformities bilateral. No pain, crepitus, or limitation noted with foot and ankle range of motion bilateral. Muscular strength 5/5 in all groups tested bilateral.  Mild hallux abductovalgus deformity today with mild hammertoe deformities noted.  None are rigid and deformity.  She has mild limitus on range of motion of the first metatarsophalangeal joint right foot  Gait: Unassisted,  Nonantalgic.    Radiographs:  Radiographs taken today demonstrate an osseously mature individual with hallux abductovalgus deformity bilaterally mild tailor's bunion deformities bilateral hammertoe deformities #2 #3 #4 bilaterally.  Osteoarthritic changes with joint space narrowing first metatarsophalangeal joints bilaterallyPlantar distally oriented calcaneal heel spurs bilaterally it appears that there is some osteoarthritic changes of the ankle right.  Otherwise some mild osteoarthritic changes of the midfoot.  Assessment & Plan:   Assessment: Diabetes without complications other than structural deformities hallux valgus deformity and hammertoe deformities.  Plan: Discussed etiology pathology conservative versus surgical therapies.  Discussed appropriate shoe gear stretching exercises ice therapy shoe gear modifications possible need for surgical intervention at some point for bunions and hammertoes.  We will follow-up with her on an as-needed basis.     Seniah Lawrence T. [place], [place]

## 2020-06-18 DIAGNOSIS — Z7189 Other specified counseling: Secondary | ICD-10-CM | POA: Diagnosis not present

## 2020-06-18 DIAGNOSIS — I10 Essential (primary) hypertension: Secondary | ICD-10-CM | POA: Diagnosis not present

## 2020-06-18 DIAGNOSIS — E1165 Type 2 diabetes mellitus with hyperglycemia: Secondary | ICD-10-CM | POA: Diagnosis not present

## 2020-06-18 DIAGNOSIS — M8580 Other specified disorders of bone density and structure, unspecified site: Secondary | ICD-10-CM | POA: Diagnosis not present

## 2020-06-18 DIAGNOSIS — E210 Primary hyperparathyroidism: Secondary | ICD-10-CM | POA: Diagnosis not present

## 2020-07-14 DIAGNOSIS — E119 Type 2 diabetes mellitus without complications: Secondary | ICD-10-CM | POA: Diagnosis not present

## 2020-07-16 DIAGNOSIS — Z23 Encounter for immunization: Secondary | ICD-10-CM | POA: Diagnosis not present

## 2020-07-27 ENCOUNTER — Other Ambulatory Visit (HOSPITAL_BASED_OUTPATIENT_CLINIC_OR_DEPARTMENT_OTHER): Payer: Self-pay | Admitting: Internal Medicine

## 2020-07-27 ENCOUNTER — Ambulatory Visit: Payer: Medicare Other | Attending: Internal Medicine

## 2020-07-27 DIAGNOSIS — Z23 Encounter for immunization: Secondary | ICD-10-CM

## 2020-07-27 NOTE — Progress Notes (Addendum)
   Covid-19 Vaccination Clinic  Name:  Doris [name]    MRN: [id number] DOB: 01-Jul-1946  07/27/2020  [name] was observed post Covid-19 immunization for 30 minutes without incident. She was provided with Vaccine Information Sheet and instruction to access the V-Safe system.  Vaccinated by [facility]  [name] was instructed to call 911 with any severe reactions post vaccine: Marland Kitchen Difficulty breathing  . Swelling of face and throat  . A fast heartbeat  . A bad rash all over body  . Dizziness and weakness

## 2020-08-05 ENCOUNTER — Other Ambulatory Visit: Payer: Self-pay | Admitting: Obstetrics and Gynecology

## 2020-08-05 DIAGNOSIS — Z1231 Encounter for screening mammogram for malignant neoplasm of breast: Secondary | ICD-10-CM

## 2020-09-20 DIAGNOSIS — Z01419 Encounter for gynecological examination (general) (routine) without abnormal findings: Secondary | ICD-10-CM | POA: Diagnosis not present

## 2020-09-20 DIAGNOSIS — Z124 Encounter for screening for malignant neoplasm of cervix: Secondary | ICD-10-CM | POA: Diagnosis not present

## 2020-09-20 DIAGNOSIS — Z1211 Encounter for screening for malignant neoplasm of colon: Secondary | ICD-10-CM | POA: Diagnosis not present

## 2020-10-13 ENCOUNTER — Other Ambulatory Visit: Payer: Self-pay

## 2020-10-13 ENCOUNTER — Ambulatory Visit
Admission: RE | Admit: 2020-10-13 | Discharge: 2020-10-13 | Disposition: A | Discharge status: Home / Self Care | Payer: Medicare Other | Source: Ambulatory Visit | Attending: Obstetrics and Gynecology | Admitting: Obstetrics and Gynecology

## 2020-10-13 DIAGNOSIS — Z1231 Encounter for screening mammogram for malignant neoplasm of breast: Secondary | ICD-10-CM

## 2020-10-30 DIAGNOSIS — Z1152 Encounter for screening for COVID-19: Secondary | ICD-10-CM | POA: Diagnosis not present

## 2020-12-23 ENCOUNTER — Telehealth: Payer: Self-pay | Admitting: Cardiovascular Disease

## 2020-12-23 NOTE — Telephone Encounter (Signed)
Spoke with the patient. Fasting lab orders are already placed. Patient aware.

## 2020-12-23 NOTE — Telephone Encounter (Signed)
New Message:        Pt says she usually have her lab work a week before she see Dr C. Her appt with Dr C is 02-24-21, would you please order lab work?

## 2021-01-19 DIAGNOSIS — M8580 Other specified disorders of bone density and structure, unspecified site: Secondary | ICD-10-CM | POA: Diagnosis not present

## 2021-01-19 DIAGNOSIS — I10 Essential (primary) hypertension: Secondary | ICD-10-CM | POA: Diagnosis not present

## 2021-01-19 DIAGNOSIS — E119 Type 2 diabetes mellitus without complications: Secondary | ICD-10-CM | POA: Diagnosis not present

## 2021-01-19 DIAGNOSIS — E210 Primary hyperparathyroidism: Secondary | ICD-10-CM | POA: Diagnosis not present

## 2021-02-01 ENCOUNTER — Ambulatory Visit: Payer: Medicare Other | Attending: Internal Medicine

## 2021-02-01 DIAGNOSIS — Z23 Encounter for immunization: Secondary | ICD-10-CM

## 2021-02-01 NOTE — Progress Notes (Signed)
   Covid-19 Vaccination Clinic  Name:  Doris [name]    MRN: [id number] DOB: 03-22-1946  02/01/2021  [name] was observed post Covid-19 immunization for 15 minutes without incident. She was provided with Vaccine Information Sheet and instruction to access the V-Safe system.   [name] was instructed to call 911 with any severe reactions post vaccine: Marland Kitchen Difficulty breathing  . Swelling of face and throat  . A fast heartbeat  . A bad rash all over body  . Dizziness and weakness   Immunizations Administered    Name Date Dose VIS Date Route   PFIZER Comrnaty(Gray TOP) Covid-19 Vaccine 02/01/2021 10:14 AM 0.3 mL 09/30/2020 Intramuscular   Manufacturer: [employer], [employer]   Lot: [id number]   NDC: [phone]

## 2021-02-03 DIAGNOSIS — I10 Essential (primary) hypertension: Secondary | ICD-10-CM | POA: Diagnosis not present

## 2021-02-03 DIAGNOSIS — Z0000 Encounter for general adult medical examination without abnormal findings: Secondary | ICD-10-CM | POA: Diagnosis not present

## 2021-02-03 DIAGNOSIS — E785 Hyperlipidemia, unspecified: Secondary | ICD-10-CM | POA: Diagnosis not present

## 2021-02-03 DIAGNOSIS — M8580 Other specified disorders of bone density and structure, unspecified site: Secondary | ICD-10-CM | POA: Diagnosis not present

## 2021-02-03 DIAGNOSIS — N200 Calculus of kidney: Secondary | ICD-10-CM | POA: Diagnosis not present

## 2021-02-03 DIAGNOSIS — E210 Primary hyperparathyroidism: Secondary | ICD-10-CM | POA: Diagnosis not present

## 2021-02-03 DIAGNOSIS — M170 Bilateral primary osteoarthritis of knee: Secondary | ICD-10-CM | POA: Diagnosis not present

## 2021-02-04 ENCOUNTER — Other Ambulatory Visit: Payer: Self-pay | Admitting: *Deleted

## 2021-02-04 DIAGNOSIS — I10 Essential (primary) hypertension: Secondary | ICD-10-CM

## 2021-02-04 DIAGNOSIS — E7800 Pure hypercholesterolemia, unspecified: Secondary | ICD-10-CM

## 2021-02-04 NOTE — Progress Notes (Signed)
Patient came today for labs, order had expired , new order placed

## 2021-02-05 LAB — COMPREHENSIVE METABOLIC PANEL
ALT: 21 IU/L (ref 0–32)
AST: 19 IU/L (ref 0–40)
Albumin/Globulin Ratio: 1.6 (ref 1.2–2.2)
Albumin: 4.6 g/dL (ref 3.7–4.7)
Alkaline Phosphatase: 68 IU/L (ref 44–121)
BUN/Creatinine Ratio: 23 (ref 12–28)
BUN: 14 mg/dL (ref 8–27)
Bilirubin Total: 0.5 mg/dL (ref 0.0–1.2)
CO2: 22 mmol/L (ref 20–29)
Calcium: 11 mg/dL — ABNORMAL HIGH (ref 8.7–10.3)
Chloride: 102 mmol/L (ref 96–106)
Creatinine, Ser: 0.61 mg/dL (ref 0.57–1.00)
Globulin, Total: 2.9 g/dL (ref 1.5–4.5)
Glucose: 96 mg/dL (ref 65–99)
Potassium: 4.2 mmol/L (ref 3.5–5.2)
Sodium: 142 mmol/L (ref 134–144)
Total Protein: 7.5 g/dL (ref 6.0–8.5)
eGFR: 94 mL/min/{1.73_m2} (ref >59)

## 2021-02-05 LAB — LIPID PANEL
Chol/HDL Ratio: 2.9 ratio (ref 0.0–4.4)
Cholesterol, Total: 143 mg/dL (ref 100–199)
HDL: 49 mg/dL (ref >39)
LDL Chol Calc (NIH): 76 mg/dL (ref 0–99)
Triglycerides: 95 mg/dL (ref 0–149)
VLDL Cholesterol Cal: 18 mg/dL (ref 5–40)

## 2021-02-07 ENCOUNTER — Encounter: Payer: Self-pay | Admitting: *Deleted

## 2021-02-07 ENCOUNTER — Other Ambulatory Visit (HOSPITAL_BASED_OUTPATIENT_CLINIC_OR_DEPARTMENT_OTHER): Payer: Self-pay

## 2021-02-07 MED ORDER — PFIZER-BIONT COVID-19 VAC-TRIS 30 MCG/0.3ML IM SUSP
INTRAMUSCULAR | 0 refills | Status: DC
  Filled 2021-02-07: qty 0.3, 1d supply, fill #0

## 2021-02-10 ENCOUNTER — Encounter: Payer: Self-pay | Admitting: Cardiovascular Disease

## 2021-02-10 ENCOUNTER — Ambulatory Visit (INDEPENDENT_AMBULATORY_CARE_PROVIDER_SITE_OTHER): Payer: Medicare Other | Admitting: Cardiovascular Disease

## 2021-02-10 ENCOUNTER — Other Ambulatory Visit: Payer: Self-pay

## 2021-02-10 VITALS — BP 136/72 | HR 80 | Ht 62.0 in | Wt 149.4 lb

## 2021-02-10 DIAGNOSIS — I10 Essential (primary) hypertension: Secondary | ICD-10-CM | POA: Diagnosis not present

## 2021-02-10 DIAGNOSIS — Z01810 Encounter for preprocedural cardiovascular examination: Secondary | ICD-10-CM | POA: Diagnosis not present

## 2021-02-10 DIAGNOSIS — E210 Primary hyperparathyroidism: Secondary | ICD-10-CM | POA: Diagnosis not present

## 2021-02-10 DIAGNOSIS — E785 Hyperlipidemia, unspecified: Secondary | ICD-10-CM

## 2021-02-10 NOTE — Progress Notes (Signed)
Cardiology Office Note    Date:  02/10/2021   ID:  Doris [name], DOB 02/28/1946, MRN [id number]  PCP:  Vernie Shanks, MD  Cardiologist:   Sanda Klein, MD   Chief Complaint  Patient presents with  . Hyperlipidemia  . Hypertension    History of Present Illness:  Doris [name] is a 74 y.o. female with hypertension, hyperlipidemia previous history of severe obesity (now overweight only), hyperparathyroidism returning for routine follow-up.   Doris [name] has had a good year.  She is really dedicated resolved to a healthier diet and exercise and has managed to lose 30 pounds, with subsequent improvement in all her metabolic parameters.  Her recent hemoglobin A1c was down to 6.5%.  Her HDL has improved to 49 from less than 40 and her LDL is the lowest it ever been at 76.  Her blood pressure is well controlled.  She continues to have mild asymptomatic hypercalcemia managed conservatively by Dr. Buddy Duty.  Her biggest problem continues to be right hip pain.  She hopes to be able to get relief with a joint injection and worries that she might need hip replacement.  She has an appointment coming up with her orthopedic surgeon, Dr. Alvan Dame.  The patient specifically denies any chest pain at rest exertion, dyspnea at rest or with exertion, orthopnea, paroxysmal nocturnal dyspnea, syncope, palpitations, focal neurological deficits, intermittent claudication, lower extremity edema, unexplained weight gain, cough, hemoptysis or wheezing.     Past Medical History:  Diagnosis Date  . Abnormal uterine bleeding   . Condyloma   . Degenerative disc disease    C-spine, back, hip surgery  . Depression   . Dysplasia of cervix, low grade (CIN 1)   . Hypercalcemia   . Hyperlipidemia   . Hypertension   . Migraine   . MRSA (methicillin resistant Staphylococcus aureus) 2009   after back surg- #3 I&D surgewries after    Past Surgical History:  Procedure Laterality Date  . CARDIOVASCULAR STRESS TEST   05/24/2011   No scintigraphic evidence of inducible myocardial ischemia. Alithough there are no reversible perfusion defects, the presence of a TID ratio >1.2 is concerning for multivessel disease.  . CERVICAL SPINE SURGERY    . GYNECOLOGIC CRYOSURGERY    . [place]  2009  . TOTAL HIP ARTHROPLASTY    . UPPER EXTREMITY VENOUS DOPPLER  11/14/2008   No evidence of DVT or superficial thrombosis of the right upper extremity    Current Medications: Outpatient Medications Prior to Visit  Medication Sig Dispense Refill  . alendronate (FOSAMAX) 70 MG tablet Take 70 mg by mouth every 7 (seven) days.    Marland Kitchen aspirin EC 81 MG tablet Take 81 mg by mouth daily.    . benazepril (LOTENSIN) 20 MG tablet TAKE 1 TABLET BY MOUTH EVERY DAY 90 tablet 3  . Cholecalciferol (VITAMIN D-3) 1000 UNITS CAPS Take 2,000 Units by mouth daily.    Marland Kitchen COVID-19 mRNA Vac-TriS, Pfizer, (PFIZER-BIONT COVID-19 VAC-TRIS) SUSP injection Inject into the muscle. 0.3 mL 0  . COVID-19 mRNA vaccine, Pfizer, 30 MCG/0.3ML injection AS DIRECTED .3 mL 0  . KLOR-CON M20 20 MEQ tablet TAKE 1 TABLET BY MOUTH EVERY DAY 90 tablet 3  . Multiple Vitamin (MULTIVITAMIN) tablet Take 1 tablet by mouth daily.    . furosemide (LASIX) 20 MG tablet Take 1 tablet (20 mg total) by mouth daily. 90 tablet 3  . simvastatin (ZOCOR) 40 MG tablet Take 1 tablet (40 mg total) by mouth  at bedtime. 90 tablet 3   No facility-administered medications prior to visit.     Allergies:   Vancomycin, Hydrocodone-acetaminophen, Penicillins, Rifampin, Rocephin [ceftriaxone sodium in dextrose], Clindamycin/lincomycin, Codeine, and Doxycycline   Social History   Socioeconomic History  . Marital status: Divorced    Spouse name: Not on file  . Number of children: Not on file  . Years of education: Not on file  . Highest education level: Not on file  Occupational History  . Not on file  Tobacco Use  . Smoking status: Never Smoker  . Smokeless tobacco: Never Used   Substance and Sexual Activity  . Alcohol use: No  . Drug use: No  . Sexual activity: Not on file  Other Topics Concern  . Not on file  Social History Narrative  . Not on file   Social Determinants of Health   Financial Resource Strain: Not on file  Food Insecurity: Not on file  Transportation Needs: Not on file  Physical Activity: Not on file  Stress: Not on file  Social Connections: Not on file     Family History:  The patient's family history includes COPD in her sister; Heart disease in her father and mother; Stroke in her father and mother.   ROS:   Please see the history of present illness.    ROS All other systems reviewed and are negative.   PHYSICAL EXAM:   VS:  BP 136/72   Pulse 80   Ht 5\' 2"  (1.575 m)   Wt 149 lb 6.4 oz (67.8 kg)   BMI 27.33 kg/m      General: Alert, oriented x3, no distress, mildly overweight Head: no evidence of trauma, PERRL, EOMI, no exophtalmos or lid lag, no myxedema, no xanthelasma; normal ears, nose and oropharynx Neck: normal jugular venous pulsations and no hepatojugular reflux; brisk carotid pulses without delay and no carotid bruits Chest: clear to auscultation, no signs of consolidation by percussion or palpation, normal fremitus, symmetrical and full respiratory excursions Cardiovascular: normal position and quality of the apical impulse, regular rhythm, normal first and second heart sounds, no murmurs, rubs or gallops Abdomen: no tenderness or distention, no masses by palpation, no abnormal pulsatility or arterial bruits, normal bowel sounds, no hepatosplenomegaly Extremities: no clubbing, cyanosis or edema; 2+ radial, ulnar and brachial pulses bilaterally; 2+ right femoral, posterior tibial and dorsalis pedis pulses; 2+ left femoral, posterior tibial and dorsalis pedis pulses; no subclavian or femoral bruits Neurological: grossly nonfocal Psych: Normal mood and affect   Wt Readings from Last 3 Encounters:  02/10/21 149 lb 6.4  oz (67.8 kg)  12/09/19 171 lb 9.6 oz (77.8 kg)      Studies/Labs Reviewed:   EKG:  EKG is ordered today.  Shows normal sinus rhythm and is a normal tracing. Recent Labs: 02/04/2021: ALT 21; BUN 14; Creatinine, Ser 0.61; Potassium 4.2; Sodium 142   Lipid Panel    Component Value Date/Time   CHOL 143 02/04/2021 0000   TRIG 95 02/04/2021 0000   HDL 49 02/04/2021 0000   CHOLHDL 2.9 02/04/2021 0000   CHOLHDL 3.2 06/27/2016 0805   VLDL 22 06/27/2016 0805   LDLCALC 76 02/04/2021 0000     ASSESSMENT:    1. Dyslipidemia (high LDL; low HDL)   2. Essential hypertension   3. Hyperparathyroidism, primary ([place])      PLAN:  In order of problems listed above:  1. HLP: She has made excellent strides in losing weight and being physically fit.  Improved  lipid profile, well within target parameters. 2. HTN: Well-controlled. 3. Hyperparathyroidism: Calcium stable at 11.0-11.9.  Following up with Dr. Buddy Duty.   4. Right hip osteoarthritis: If the decision is reached to proceed with orthopedic surgery, I think she is at low risk for major cardiovascular complaints and do not recommend any additional work-up before that surgery.    Medication Adjustments/Labs and Tests Ordered: Current medicines are reviewed at length with the patient today.  Concerns regarding medicines are outlined above.  Medication changes, Labs and Tests ordered today are listed in the Patient Instructions below. Patient Instructions  Medication Instructions:  No changes *If you need a refill on your cardiac medications before your next appointment, please call your pharmacy*   Lab Work: None ordered If you have labs (blood work) drawn today and your tests are completely normal, you will receive your results only by: Marland Kitchen MyChart Message (if you have MyChart) OR . A paper copy in the mail If you have any lab test that is abnormal or we need to change your treatment, we will call you to review the  results.   Testing/Procedures: None ordered   Follow-Up: At [facility], you and your health needs are our priority.  As part of our continuing mission to provide you with exceptional heart care, we have created designated Provider Care Teams.  These Care Teams include your primary Cardiologist (physician) and Advanced Practice Providers (APPs -  Physician Assistants and Nurse Practitioners) who all work together to provide you with the care you need, when you need it.  We recommend signing up for the patient portal called "MyChart".  Sign up information is provided on this After Visit Summary.  MyChart is used to connect with patients for Virtual Visits (Telemedicine).  Patients are able to view lab/test results, encounter notes, upcoming appointments, etc.  Non-urgent messages can be sent to your provider as well.   To learn more about what you can do with MyChart, go to [email or web address].    Your next appointment:   12 month(s)  The format for your next appointment:   In Person  Provider:   You may see Sanda Klein, MD or one of the following Advanced Practice Providers on your designated Care Team:    Almyra Deforest, PA-C  Fabian Sharp, [place] or   Roby Lofts, PA-C     Signed, Sanda Klein, MD  02/10/2021 10:15 AM    [facility] [address], [place], [place]  [address] Phone: [phone]; Fax: [phone]

## 2021-02-10 NOTE — Patient Instructions (Signed)

## 2021-02-24 ENCOUNTER — Ambulatory Visit: Payer: Medicare Other | Admitting: Cardiovascular Disease

## 2021-02-25 ENCOUNTER — Other Ambulatory Visit: Payer: Self-pay | Admitting: Cardiovascular Disease

## 2021-03-02 ENCOUNTER — Other Ambulatory Visit: Payer: Self-pay | Admitting: Cardiovascular Disease

## 2021-03-06 DIAGNOSIS — U071 COVID-19: Secondary | ICD-10-CM | POA: Diagnosis not present

## 2021-03-19 ENCOUNTER — Other Ambulatory Visit: Payer: Self-pay | Admitting: Cardiovascular Disease

## 2021-04-13 DIAGNOSIS — M1611 Unilateral primary osteoarthritis, right hip: Secondary | ICD-10-CM | POA: Diagnosis not present

## 2021-04-13 DIAGNOSIS — Z96642 Presence of left artificial hip joint: Secondary | ICD-10-CM | POA: Diagnosis not present

## 2021-04-19 ENCOUNTER — Telehealth: Payer: Self-pay

## 2021-04-19 NOTE — Progress Notes (Addendum)
PCP - Dr. Minette Brine , MD Cardiologist - clearance Sanda Klein, MD 02-10-21 epic  PPM/ICD -  Device Orders -  Rep Notified -   Chest x-ray -  EKG - 02-10-21 Stress Test -  ECHO -  Cardiac Cath -   Sleep Study -  CPAP -   Fasting Blood Sugar -  Checks Blood Sugar _____ times a day  Blood Thinner Instructions: Aspirin Instructions:81 mg  ERAS Protcol - PRE-SURGERY Ensure or G2-   COVID TEST- 04-29-21  Activity--Able to walk a flightof stairs without SOB Anesthesia review: HTN, Pre DM  Patient denies shortness of breath, fever, cough and chest pain at PAT appointment   All instructions explained to the patient, with a verbal understanding of the material. Patient agrees to go over the instructions while at home for a better understanding. Patient also instructed to self quarantine after being tested for COVID-19. The opportunity to ask questions was provided.

## 2021-04-19 NOTE — Telephone Encounter (Signed)
   [facility] Pre-operative Risk Assessment    Patient Name: Doris [name]  DOB: Oct 20, 1946  MRN: [id number]   HEARTCARE STAFF: - Please ensure there is not already an duplicate clearance open for this procedure. - Under Visit Info/Reason for Call, type in Other and utilize the format Clearance MM/DD/YY or Clearance TBD. Do not use dashes or single digits. - If request is for dental extraction, please clarify the # of teeth to be extracted. - If the patient is currently at the dentist's office, call Pre-Op APP to address. If the patient is not currently in the dentist office, please route to the Pre-Op pool  Request for surgical clearance:  What type of surgery is being performed? Right Total Hip Arthroplasty    When is this surgery scheduled? 05/03/2021   What type of clearance is required (medical clearance vs. Pharmacy clearance to hold med vs. Both)? BOTH   Are there any medications that need to be held prior to surgery and how long? Aspirin    Practice name and name of physician performing surgery? [facility]- Dr.Matthew Alvan Dame    What is the office phone number? [phone]   7.   What is the office fax number? [phone]  8.   Anesthesia type (None, local, MAC, general) ? Spinal   Ena Dawley 04/19/2021, 5:00 PM  _________________________________________________________________   (provider comments below)

## 2021-04-19 NOTE — Patient Instructions (Addendum)
DUE TO COVID-19 ONLY ONE VISITOR IS ALLOWED TO COME WITH YOU AND STAY IN THE WAITING ROOM ONLY DURING PRE OP AND PROCEDURE DAY OF SURGERY.   TWO VISITOR  MAY VISIT WITH YOU AFTER SURGERY IN YOUR PRIVATE ROOM DURING VISITING HOURS ONLY!  YOU NEED TO HAVE A COVID 19 TEST ON__7-8-22_____ @_______ , THIS TEST MUST BE DONE BEFORE SURGERY,    COVID TESTING SITE [address] [place] [address],   IT IS ON THE RIGHT GOING OUT [address] APPROXIMATELY  2 MINUTES PAST [employer] ON THE RIGHT. ONCE YOUR COVID TEST IS COMPLETED,  PLEASE Wear a mask when in public           Your procedure is scheduled on: 05-03-21   Report to [facility] Main  Entrance   Report to admitting at     1035  AM     Call this number if you have problems the morning of surgery [phone]    Remember: NO SOLID FOOD AFTER MIDNIGHT THE NIGHT PRIOR TO SURGERY. NOTHING BY MOUTH EXCEPT CLEAR LIQUIDS UNTIL    0950 am. PLEASE FINISH ENSURE DRINK PER SURGEON ORDER  WHICH NEEDS TO BE COMPLETED AT     0950 am then nothing by mouth .     CLEAR LIQUID DIET   Foods Allowed                                                                     Foods Excluded water Black Coffee and tea, regular and decaf                             liquids that you cannot  Plain Jell-O any favor except red or purple                                           see through such as: Fruit ices (not with fruit pulp)                                                    milk, soups, orange juice  Iced Popsicles                                                     All solid food Carbonated beverages, regular and diet                                    Cranberry, grape and apple juices Sports drinks like Gatorade Lightly seasoned clear broth or consume(fat free) Sugar, honey syrup   _____________________________________________________________________     BRUSH YOUR TEETH MORNING OF SURGERY AND RINSE YOUR MOUTH OUT, NO  CHEWING GUM CANDY OR MINTS.  Take these medicines the morning of surgery with A SIP OF WATER: none  DO NOT TAKE ANY DIABETIC MEDICATIONS DAY OF YOUR SURGERY                               You may not have any metal on your body including hair pins and              piercings  Do not wear jewelry, make-up, lotions, powders or perfumes, deodorant             Do not wear nail polish on your fingernails or toenails .  Do not shave  48 hours prior to surgery.                Do not bring valuables to the hospital. [place].  Contacts, dentures or bridgework may not be worn into surgery.       Patients discharged the day of surgery will not be allowed to drive home. IF YOU ARE HAVING SURGERY AND GOING HOME THE SAME DAY, YOU MUST HAVE AN ADULT TO DRIVE YOU HOME AND BE WITH YOU FOR 24 HOURS. YOU MAY GO HOME BY TAXI OR UBER OR ORTHERWISE, BUT AN ADULT MUST ACCOMPANY YOU HOME AND STAY WITH YOU FOR 24 HOURS.  Name and phone number of your driver:  Special Instructions: N/A              Please read over the following fact sheets you were given: _____________________________________________________________________             [facility] - Preparing for Surgery Before surgery, you can play an important role.  Because skin is not sterile, your skin needs to be as free of germs as possible.  You can reduce the number of germs on your skin by washing with CHG (chlorahexidine gluconate) soap before surgery.  CHG is an antiseptic cleaner which kills germs and bonds with the skin to continue killing germs even after washing. Please DO NOT use if you have an allergy to CHG or antibacterial soaps.  If your skin becomes reddened/irritated stop using the CHG and inform your nurse when you arrive at Short Stay. Do not shave (including legs and underarms) for at least 48 hours prior to the first CHG shower.  You may shave your face/neck. Please follow these  instructions carefully:  1.  Shower with CHG Soap the night before surgery and the  morning of Surgery.  2.  If you choose to wash your hair, wash your hair first as usual with your  normal  shampoo.  3.  After you shampoo, rinse your hair and body thoroughly to remove the  shampoo.                           4.  Use CHG as you would any other liquid soap.  You can apply chg directly  to the skin and wash                       Gently with a scrungie or clean washcloth.  5.  Apply the CHG Soap to your body ONLY FROM THE NECK DOWN.   Do not use on face/ open  Wound or open sores. Avoid contact with eyes, ears mouth and genitals (private parts).                       Wash face,  Genitals (private parts) with your normal soap.             6.  Wash thoroughly, paying special attention to the area where your surgery  will be performed.  7.  Thoroughly rinse your body with warm water from the neck down.  8.  DO NOT shower/wash with your normal soap after using and rinsing off  the CHG Soap.                9.  Pat yourself dry with a clean towel.            10.  Wear clean pajamas.            11.  Place clean sheets on your bed the night of your first shower and do not  sleep with pets. Day of Surgery : Do not apply any lotions/deodorants the morning of surgery.  Please wear clean clothes to the hospital/surgery center.  FAILURE TO FOLLOW THESE INSTRUCTIONS MAY RESULT IN THE CANCELLATION OF YOUR SURGERY PATIENT SIGNATURE_________________________________  NURSE SIGNATURE__________________________________   Incentive Spirometer  An incentive spirometer is a tool that can help keep your lungs clear and active. This tool measures how well you are filling your lungs with each breath. Taking long deep breaths may help reverse or decrease the chance of developing breathing (pulmonary) problems (especially infection) following: A long period of time when you are unable to move or be  active. BEFORE THE PROCEDURE  If the spirometer includes an indicator to show your best effort, your nurse or respiratory therapist will set it to a desired goal. If possible, sit up straight or lean slightly forward. Try not to slouch. Hold the incentive spirometer in an upright position. INSTRUCTIONS FOR USE  Sit on the edge of your bed if possible, or sit up as far as you can in bed or on a chair. Hold the incentive spirometer in an upright position. Breathe out normally. Place the mouthpiece in your mouth and seal your lips tightly around it. Breathe in slowly and as deeply as possible, raising the piston or the ball toward the top of the column. Hold your breath for 3-5 seconds or for as long as possible. Allow the piston or ball to fall to the bottom of the column. Remove the mouthpiece from your mouth and breathe out normally. Rest for a few seconds and repeat Steps 1 through 7 at least 10 times every 1-2 hours when you are awake. Take your time and take a few normal breaths between deep breaths. The spirometer may include an indicator to show your best effort. Use the indicator as a goal to work toward during each repetition. After each set of 10 deep breaths, practice coughing to be sure your lungs are clear. If you have an incision (the cut made at the time of surgery), support your incision when coughing by placing a pillow or rolled up towels firmly against it. Once you are able to get out of bed, walk around indoors and cough well. You may stop using the incentive spirometer when instructed by your caregiver.  RISKS AND COMPLICATIONS Take your time so you do not get dizzy or light-headed. If you are in pain, you may need to take or ask for pain  medication before doing incentive spirometry. It is harder to take a deep breath if you are having pain. AFTER USE Rest and breathe slowly and easily. It can be helpful to keep track of a log of your progress. Your caregiver can provide you  with a simple table to help with this. If you are using the spirometer at home, follow these instructions: [place] IF:  You are having difficultly using the spirometer. You have trouble using the spirometer as often as instructed. Your pain medication is not giving enough relief while using the spirometer. You develop fever of 100.5 F (38.1 C) or higher. SEEK IMMEDIATE MEDICAL CARE IF:  You cough up bloody sputum that had not been present before. You develop fever of 102 F (38.9 C) or greater. You develop worsening pain at or near the incision site. MAKE SURE YOU:  Understand these instructions. Will watch your condition. Will get help right away if you are not doing well or get worse. Document Released: 02/19/2007 Document Revised: 01/01/2012 Document Reviewed: 04/22/2007 [facility] Patient Information 2014 [employer], [place].   ________________________________________________________________________  ________________________________________________________________________

## 2021-04-19 NOTE — Progress Notes (Signed)
Please place orders in epic pt is scheduled for a preop

## 2021-04-20 NOTE — Telephone Encounter (Signed)
   Name: Doris [name]  DOB: [birth year]  MRN: [id number]   Primary Cardiologist: Sanda Klein, MD  Chart reviewed as part of pre-operative protocol coverage. Patient was contacted 04/20/2021 in reference to pre-operative risk assessment for pending surgery as outlined below.  Doris [name] was last seen on 02/10/21 by Dr. Sallyanne Kuster.  Since that day, Doris [name] has done well. She was cleared for hip at that appt. I reached out to her and she has had no interval change in cardiovascular health or symptoms. She may hold ASA for 5-7 days prior to surgery if needed.   Therefore, based on ACC/AHA guidelines, the patient would be at acceptable risk for the planned procedure without further cardiovascular testing.   The patient was advised that if she develops new symptoms prior to surgery to contact our office to arrange for a follow-up visit, and she verbalized understanding.  I will route this recommendation to the requesting party via Epic fax function and remove from pre-op pool. Please call with questions.  Tami Lin Emanuela Runnion, PA 04/20/2021, 11:19 AM

## 2021-04-21 ENCOUNTER — Encounter (HOSPITAL_COMMUNITY)
Admission: RE | Admit: 2021-04-21 | Discharge: 2021-04-21 | Disposition: A | Discharge status: Home / Self Care | Payer: Medicare Other | Source: Ambulatory Visit | Attending: Orthopedic Surgery | Admitting: Orthopedic Surgery

## 2021-04-21 ENCOUNTER — Other Ambulatory Visit: Payer: Self-pay

## 2021-04-21 ENCOUNTER — Encounter (HOSPITAL_COMMUNITY): Payer: Self-pay

## 2021-04-21 DIAGNOSIS — Z01812 Encounter for preprocedural laboratory examination: Secondary | ICD-10-CM | POA: Diagnosis not present

## 2021-04-21 HISTORY — DX: Pneumonia, unspecified organism: J18.9

## 2021-04-21 HISTORY — DX: Disorder of parathyroid gland, unspecified: E21.5

## 2021-04-21 HISTORY — DX: Prediabetes: R73.03

## 2021-04-21 LAB — COMPREHENSIVE METABOLIC PANEL
ALT: 21 U/L (ref 0–44)
AST: 21 U/L (ref 15–41)
Albumin: 4.6 g/dL (ref 3.5–5.0)
Alkaline Phosphatase: 62 U/L (ref 38–126)
Anion gap: 8 (ref 5–15)
BUN: 16 mg/dL (ref 8–23)
CO2: 28 mmol/L (ref 22–32)
Calcium: 12.1 mg/dL — ABNORMAL HIGH (ref 8.9–10.3)
Chloride: 107 mmol/L (ref 98–111)
Creatinine, Ser: 0.53 mg/dL (ref 0.44–1.00)
GFR, Estimated: >60 mL/min (ref >60)
Glucose, Bld: 99 mg/dL (ref 70–99)
Potassium: 3.9 mmol/L (ref 3.5–5.1)
Sodium: 143 mmol/L (ref 135–145)
Total Bilirubin: 0.6 mg/dL (ref 0.3–1.2)
Total Protein: 8.4 g/dL — ABNORMAL HIGH (ref 6.5–8.1)

## 2021-04-21 LAB — CBC
HCT: 44.9 % (ref 36.0–46.0)
Hemoglobin: 14.5 g/dL (ref 12.0–15.0)
MCH: 29.4 pg (ref 26.0–34.0)
MCHC: 32.3 g/dL (ref 30.0–36.0)
MCV: 90.9 fL (ref 80.0–100.0)
Platelets: 290 10*3/uL (ref 150–400)
RBC: 4.94 MIL/uL (ref 3.87–5.11)
RDW: 14 % (ref 11.5–15.5)
WBC: 10.8 10*3/uL — ABNORMAL HIGH (ref 4.0–10.5)
nRBC: 0 % (ref 0.0–0.2)

## 2021-04-21 LAB — TYPE AND SCREEN
ABO/RH(D): O NEG
Antibody Screen: NEGATIVE

## 2021-04-21 LAB — SURGICAL PCR SCREEN
MRSA, PCR: NEGATIVE
Staphylococcus aureus: POSITIVE — AB

## 2021-04-22 LAB — HEMOGLOBIN A1C
Hgb A1c MFr Bld: 5.8 % — ABNORMAL HIGH (ref 4.8–5.6)
Mean Plasma Glucose: 120 mg/dL

## 2021-04-26 NOTE — H&P (Signed)
TOTAL HIP ADMISSION H&P  Patient is admitted for right total hip arthroplasty.  Subjective:  Chief Complaint: right hip pain  HPI: Doris [name], 75 y.o. female, has a history of pain and functional disability in the right hip(s) due to arthritis and patient has failed non-surgical conservative treatments for greater than 12 weeks to include corticosteriod injections and activity modification.  Onset of symptoms was gradual starting 2 years ago with gradually worsening course since that time.The patient noted no past surgery on the right hip(s).  Patient currently rates pain in the right hip at 8 out of 10 with activity. Patient has worsening of pain with activity and weight bearing and pain that interfers with activities of daily living. Patient has evidence of joint space narrowing by imaging studies. This condition presents safety issues increasing the risk of falls.  There is no current active infection.  Patient Active Problem List   Diagnosis Date Noted   Abnormal cervical Papanicolaou smear 04/08/2020   Condyloma acuminatum of vulva 04/08/2020   Depressive disorder 04/08/2020   Migraine 04/08/2020   Pure hypercholesterolemia 07/16/2017   Hyperparathyroidism, primary ([place]) 06/28/2016   HTN (hypertension) 06/21/2013   Hypercalcemia 06/21/2013   Mild obesity 06/21/2013   Hyperglycemia 06/21/2013   History of abnormal Pap smear 07/10/2012   WOUND INFECTION 10/11/2008   Past Medical History:  Diagnosis Date   Abnormal uterine bleeding    Condyloma    Degenerative disc disease    C-spine, back, hip surgery   Depression    Dysplasia of cervix, low grade (CIN 1)    Hypercalcemia    Hyperlipidemia    Hypertension    Migraine    MRSA (methicillin resistant Staphylococcus aureus) 10/24/2007   after back surg- #3 I&D surgewries after   Parathyroid disease ([place])    hyperparathyroid   Pneumonia    Pre-diabetes     Past Surgical History:  Procedure Laterality Date    CARDIOVASCULAR STRESS TEST  05/24/2011   No scintigraphic evidence of inducible myocardial ischemia. Alithough there are no reversible perfusion defects, the presence of a TID ratio >1.2 is concerning for multivessel disease.   CERVICAL SPINE SURGERY     CESAREAN SECTION     GYNECOLOGIC CRYOSURGERY     SPINE SURGERY  10/24/2007   x4   TOTAL HIP ARTHROPLASTY Left    UPPER EXTREMITY VENOUS DOPPLER  11/14/2008   No evidence of DVT or superficial thrombosis of the right upper extremity    No current facility-administered medications for this encounter.   Current Outpatient Medications  Medication Sig Dispense Refill Last Dose   acetaminophen (TYLENOL) 500 MG tablet Take 500-1,000 mg by mouth every 6 (six) hours as needed (pain).      alendronate (FOSAMAX) 70 MG tablet Take 70 mg by mouth every Monday.      aspirin EC 81 MG tablet Take 81 mg by mouth in the morning.      benazepril (LOTENSIN) 20 MG tablet TAKE 1 TABLET BY MOUTH EVERY DAY (Patient taking differently: Take 20 mg by mouth in the morning.) 90 tablet 3    cephALEXin (KEFLEX) 500 MG capsule Take 2,000 mg by mouth See admin instructions. Take 4 capsules (2000 mg) by mouth 1 hour prior to dental procedures      Cholecalciferol (VITAMIN D3) 50 MCG (2000 UT) TABS Take 2,000 Units by mouth in the morning.      furosemide (LASIX) 20 MG tablet TAKE 1 TABLET BY MOUTH EVERY DAY (Patient taking differently:  Take 20 mg by mouth in the morning.) 90 tablet 3    ibuprofen (ADVIL) 200 MG tablet Take 200-400 mg by mouth every 8 (eight) hours as needed (pain.).      KLOR-CON M20 20 MEQ tablet TAKE 1 TABLET BY MOUTH EVERY DAY (Patient taking differently: Take 20 mEq by mouth in the morning.) 90 tablet 3    Multiple Vitamin (MULTIVITAMIN WITH MINERALS) TABS tablet Take 1 tablet by mouth in the morning.      simvastatin (ZOCOR) 40 MG tablet TAKE 1 TABLET BY MOUTH EVERYDAY AT BEDTIME (Patient taking differently: Take 40 mg by mouth at bedtime.) 90 tablet 3     COVID-19 mRNA Vac-TriS, Pfizer, (PFIZER-BIONT COVID-19 VAC-TRIS) SUSP injection Inject into the muscle. 0.3 mL 0    COVID-19 mRNA vaccine, Pfizer, 30 MCG/0.3ML injection AS DIRECTED .3 mL 0    Allergies  Allergen Reactions   Vancomycin Anaphylaxis and Swelling    REACTION: severe reaction   Hydrocodone-Acetaminophen Nausea And Vomiting   Sulfa Antibiotics Nausea Only and Swelling   Clindamycin/Lincomycin Swelling   Codeine Nausea Only and Other (See Comments)    upset stomach   Doxycycline Swelling   Penicillins Rash   Rifampin Rash   Rocephin [Ceftriaxone Sodium In Dextrose] Swelling and Rash    Social History   Tobacco Use   Smoking status: Never   Smokeless tobacco: Never  Substance Use Topics   Alcohol use: No    Family History  Problem Relation Age of Onset   Heart disease Mother    Stroke Mother    Stroke Father    Heart disease Father    COPD Sister    Breast cancer Neg Hx      Review of Systems  Constitutional:  Negative for chills and fever.  Respiratory:  Negative for cough and shortness of breath.   Cardiovascular:  Negative for chest pain.  Gastrointestinal:  Negative for nausea and vomiting.  Musculoskeletal:  Positive for arthralgias.   Objective:  Physical Exam  Well nourished and well developed. General: Alert and oriented x3, cooperative and pleasant, no acute distress. Head: normocephalic, atraumatic, neck supple. Eyes: EOMI.  Musculoskeletal: Right hip exam: Painful and very limited right hip range of motion today. She has pain with active hip flexion with external rotation noted. Passive range of motion is pain-free with hip flexion over 90 degrees with internal rotation to less than 5 degrees before pelvic tilting and pain. She is neurovascular intact distally without lower extremity edema or erythema  Calves soft and nontender. Motor function intact in LE. Strength 5/5 LE bilaterally. Neuro: Distal pulses 2+. Sensation to light touch  intact in LE.  Vital signs in last 24 hours:    Labs:   Estimated body mass index is 26.21 kg/m as calculated from the following:   Height as of 04/21/21: 5' 1.5" (1.562 m).   Weight as of 04/21/21: 64 kg.   Imaging Review Plain radiographs demonstrate severe degenerative joint disease of the right hip(s). The bone quality appears to be adequate for age and reported activity level.      Assessment/Plan:  End stage arthritis, right hip(s)  The patient history, physical examination, clinical judgement of the provider and imaging studies are consistent with end stage degenerative joint disease of the right hip(s) and total hip arthroplasty is deemed medically necessary. The treatment options including medical management, injection therapy, arthroscopy and arthroplasty were discussed at length. The risks and benefits of total hip arthroplasty were presented and  reviewed. The risks due to aseptic loosening, infection, stiffness, dislocation/subluxation,  thromboembolic complications and other imponderables were discussed.  The patient acknowledged the explanation, agreed to proceed with the plan and consent was signed. Patient is being admitted for inpatient treatment for surgery, pain control, PT, OT, prophylactic antibiotics, VTE prophylaxis, progressive ambulation and ADL's and discharge planning.The patient is planning to be discharged  home.  Therapy Plans: HEP Disposition: Home with mother & stepdad Planned DVT Prophylaxis: aspirin 81mg  BID DME needed: walker PCP: Dr. Brooke Bonito, clearance received TXA: IV Allergies: NKDA Anesthesia Concerns: none BMI: 37.4 Last HgbA1c: Not diabetic.  Other: - Celebrex, tylenol, Norco, Robaxin   Patient's anticipated LOS is less than 2 midnights, meeting these requirements: - Younger than 36 - Lives within 1 hour of care - Has a competent adult at home to recover with post-op recover - NO history of  - Chronic pain requiring  opiods  - Diabetes  - Coronary Artery Disease  - Heart failure  - Heart attack  - Stroke  - DVT/VTE  - Cardiac arrhythmia  - Respiratory Failure/COPD  - Renal failure  - Anemia  - Advanced Liver disease  Griffith Citron, PA-C Orthopedic Surgery [facility] [place] [phone]

## 2021-04-29 ENCOUNTER — Other Ambulatory Visit (HOSPITAL_COMMUNITY)
Admission: RE | Admit: 2021-04-29 | Discharge: 2021-04-29 | Disposition: A | Discharge status: Home / Self Care | Payer: Medicare Other | Source: Ambulatory Visit | Attending: Orthopedic Surgery | Admitting: Orthopedic Surgery

## 2021-04-29 DIAGNOSIS — Z20822 Contact with and (suspected) exposure to covid-19: Secondary | ICD-10-CM | POA: Insufficient documentation

## 2021-04-29 DIAGNOSIS — Z01812 Encounter for preprocedural laboratory examination: Secondary | ICD-10-CM | POA: Diagnosis not present

## 2021-04-29 LAB — SARS CORONAVIRUS 2 (TAT 6-24 HRS): SARS Coronavirus 2: NEGATIVE

## 2021-05-03 ENCOUNTER — Ambulatory Visit (HOSPITAL_COMMUNITY): Payer: Medicare Other

## 2021-05-03 ENCOUNTER — Ambulatory Visit (HOSPITAL_COMMUNITY): Payer: Medicare Other | Admitting: Anesthesiology

## 2021-05-03 ENCOUNTER — Other Ambulatory Visit: Payer: Self-pay

## 2021-05-03 ENCOUNTER — Encounter (HOSPITAL_COMMUNITY): Payer: Self-pay | Admitting: Orthopedic Surgery

## 2021-05-03 ENCOUNTER — Encounter (HOSPITAL_COMMUNITY)
Admission: RE | Disposition: A | Discharge status: Home / Self Care | Payer: Self-pay | Source: Home / Self Care | Attending: Orthopedic Surgery

## 2021-05-03 ENCOUNTER — Observation Stay (HOSPITAL_COMMUNITY)
Admission: RE | Admit: 2021-05-03 | Discharge: 2021-05-04 | Disposition: A | Discharge status: Home / Self Care | Payer: Medicare Other | Attending: Orthopedic Surgery | Admitting: Orthopedic Surgery

## 2021-05-03 ENCOUNTER — Observation Stay (HOSPITAL_COMMUNITY): Payer: Medicare Other

## 2021-05-03 DIAGNOSIS — Z7982 Long term (current) use of aspirin: Secondary | ICD-10-CM | POA: Diagnosis not present

## 2021-05-03 DIAGNOSIS — Z96642 Presence of left artificial hip joint: Secondary | ICD-10-CM | POA: Diagnosis not present

## 2021-05-03 DIAGNOSIS — S72041A Displaced fracture of base of neck of right femur, initial encounter for closed fracture: Secondary | ICD-10-CM

## 2021-05-03 DIAGNOSIS — G43909 Migraine, unspecified, not intractable, without status migrainosus: Secondary | ICD-10-CM | POA: Diagnosis not present

## 2021-05-03 DIAGNOSIS — Z96649 Presence of unspecified artificial hip joint: Secondary | ICD-10-CM

## 2021-05-03 DIAGNOSIS — R7303 Prediabetes: Secondary | ICD-10-CM | POA: Insufficient documentation

## 2021-05-03 DIAGNOSIS — Z79899 Other long term (current) drug therapy: Secondary | ICD-10-CM | POA: Insufficient documentation

## 2021-05-03 DIAGNOSIS — Z96643 Presence of artificial hip joint, bilateral: Secondary | ICD-10-CM | POA: Diagnosis not present

## 2021-05-03 DIAGNOSIS — I10 Essential (primary) hypertension: Secondary | ICD-10-CM | POA: Insufficient documentation

## 2021-05-03 DIAGNOSIS — M1611 Unilateral primary osteoarthritis, right hip: Secondary | ICD-10-CM | POA: Diagnosis not present

## 2021-05-03 DIAGNOSIS — E7800 Pure hypercholesterolemia, unspecified: Secondary | ICD-10-CM | POA: Diagnosis not present

## 2021-05-03 DIAGNOSIS — Z96641 Presence of right artificial hip joint: Secondary | ICD-10-CM | POA: Diagnosis not present

## 2021-05-03 DIAGNOSIS — Z471 Aftercare following joint replacement surgery: Secondary | ICD-10-CM | POA: Diagnosis not present

## 2021-05-03 HISTORY — PX: TOTAL HIP ARTHROPLASTY: SHX124

## 2021-05-03 SURGERY — ARTHROPLASTY, HIP, TOTAL, ANTERIOR APPROACH
Anesthesia: Spinal | Site: Hip | Laterality: Right

## 2021-05-03 MED ORDER — PROPOFOL 1000 MG/100ML IV EMUL
INTRAVENOUS | Status: AC
  Filled 2021-05-03: qty 100

## 2021-05-03 MED ORDER — PHENOL 1.4 % MT LIQD: 1.0000 | OROMUCOSAL | Status: DC | PRN

## 2021-05-03 MED ORDER — HYDROMORPHONE HCL 1 MG/ML IJ SOLN: 0.5000 mg | INTRAMUSCULAR | Status: DC | PRN

## 2021-05-03 MED ORDER — DEXAMETHASONE SODIUM PHOSPHATE 10 MG/ML IJ SOLN
8.0000 mg | Freq: Once | INTRAMUSCULAR | Status: AC
  Administered 2021-05-03: 8 mg via INTRAVENOUS

## 2021-05-03 MED ORDER — HYDROCODONE-ACETAMINOPHEN 5-325 MG PO TABS
1.0000 | ORAL_TABLET | ORAL | Status: DC | PRN
  Administered 2021-05-03: 1 via ORAL
  Administered 2021-05-04 (×2): 2 via ORAL
  Filled 2021-05-03: qty 2
  Filled 2021-05-03: qty 1
  Filled 2021-05-03: qty 2

## 2021-05-03 MED ORDER — BUPIVACAINE HCL (PF) 0.75 % IJ SOLN
INTRAMUSCULAR | Status: DC | PRN
  Administered 2021-05-03: 5 mg via INTRATHECAL

## 2021-05-03 MED ORDER — ASPIRIN 81 MG PO CHEW
81.0000 mg | CHEWABLE_TABLET | Freq: Two times a day (BID) | ORAL | Status: DC
  Administered 2021-05-03 – 2021-05-04 (×2): 81 mg via ORAL
  Filled 2021-05-03 (×2): qty 1

## 2021-05-03 MED ORDER — DEXAMETHASONE SODIUM PHOSPHATE 10 MG/ML IJ SOLN
10.0000 mg | Freq: Once | INTRAMUSCULAR | Status: AC
  Administered 2021-05-04: 10 mg via INTRAVENOUS
  Filled 2021-05-03: qty 1

## 2021-05-03 MED ORDER — PROPOFOL 500 MG/50ML IV EMUL
INTRAVENOUS | Status: DC | PRN
  Administered 2021-05-03: 100 ug/kg/min via INTRAVENOUS

## 2021-05-03 MED ORDER — 0.9 % SODIUM CHLORIDE (POUR BTL) OPTIME
TOPICAL | Status: DC | PRN
  Administered 2021-05-03: 1000 mL

## 2021-05-03 MED ORDER — METOCLOPRAMIDE HCL 5 MG/ML IJ SOLN: 5.0000 mg | Freq: Three times a day (TID) | INTRAMUSCULAR | Status: DC | PRN

## 2021-05-03 MED ORDER — ONDANSETRON HCL 4 MG PO TABS: 4.0000 mg | ORAL_TABLET | Freq: Four times a day (QID) | ORAL | Status: DC | PRN

## 2021-05-03 MED ORDER — STERILE WATER FOR IRRIGATION IR SOLN
Status: DC | PRN
  Administered 2021-05-03: 2000 mL

## 2021-05-03 MED ORDER — METHOCARBAMOL 500 MG PO TABS
500.0000 mg | ORAL_TABLET | Freq: Four times a day (QID) | ORAL | Status: DC | PRN
  Administered 2021-05-03 – 2021-05-04 (×2): 500 mg via ORAL
  Filled 2021-05-03 (×2): qty 1

## 2021-05-03 MED ORDER — ONDANSETRON HCL 4 MG/2ML IJ SOLN
INTRAMUSCULAR | Status: DC | PRN
  Administered 2021-05-03: 4 mg via INTRAVENOUS

## 2021-05-03 MED ORDER — DEXAMETHASONE SODIUM PHOSPHATE 10 MG/ML IJ SOLN
INTRAMUSCULAR | Status: AC
  Filled 2021-05-03: qty 1

## 2021-05-03 MED ORDER — LIDOCAINE 2% (20 MG/ML) 5 ML SYRINGE
INTRAMUSCULAR | Status: AC
  Filled 2021-05-03: qty 5

## 2021-05-03 MED ORDER — PROPOFOL 10 MG/ML IV BOLUS
INTRAVENOUS | Status: DC | PRN
  Administered 2021-05-03: 20 mg via INTRAVENOUS

## 2021-05-03 MED ORDER — ONDANSETRON HCL 4 MG/2ML IJ SOLN: 4.0000 mg | Freq: Once | INTRAMUSCULAR | Status: DC | PRN

## 2021-05-03 MED ORDER — FUROSEMIDE 20 MG PO TABS
20.0000 mg | ORAL_TABLET | Freq: Every day | ORAL | Status: DC
  Administered 2021-05-04: 20 mg via ORAL
  Filled 2021-05-03: qty 1

## 2021-05-03 MED ORDER — CHLORHEXIDINE GLUCONATE 0.12 % MT SOLN
15.0000 mL | Freq: Once | OROMUCOSAL | Status: AC
  Administered 2021-05-03: 15 mL via OROMUCOSAL

## 2021-05-03 MED ORDER — LACTATED RINGERS IV SOLN: INTRAVENOUS | Status: DC

## 2021-05-03 MED ORDER — ORAL CARE MOUTH RINSE: 15.0000 mL | Freq: Once | OROMUCOSAL | Status: AC

## 2021-05-03 MED ORDER — FENTANYL CITRATE (PF) 100 MCG/2ML IJ SOLN
INTRAMUSCULAR | Status: DC | PRN
  Administered 2021-05-03 (×2): 50 ug via INTRAVENOUS

## 2021-05-03 MED ORDER — FENTANYL CITRATE (PF) 100 MCG/2ML IJ SOLN
INTRAMUSCULAR | Status: AC
  Filled 2021-05-03: qty 2

## 2021-05-03 MED ORDER — SIMVASTATIN 40 MG PO TABS
40.0000 mg | ORAL_TABLET | Freq: Every day | ORAL | Status: DC
  Administered 2021-05-03: 40 mg via ORAL
  Filled 2021-05-03: qty 1

## 2021-05-03 MED ORDER — SODIUM CHLORIDE 0.9 % IV SOLN
2.0000 g | Freq: Four times a day (QID) | INTRAVENOUS | Status: AC
  Administered 2021-05-03 – 2021-05-04 (×2): 2 g via INTRAVENOUS
  Filled 2021-05-03 (×2): qty 2

## 2021-05-03 MED ORDER — DIPHENHYDRAMINE HCL 12.5 MG/5ML PO ELIX: 12.5000 mg | ORAL_SOLUTION | ORAL | Status: DC | PRN

## 2021-05-03 MED ORDER — METHOCARBAMOL 500 MG IVPB - SIMPLE MED
INTRAVENOUS | Status: AC
  Filled 2021-05-03: qty 50

## 2021-05-03 MED ORDER — FENTANYL CITRATE (PF) 100 MCG/2ML IJ SOLN
25.0000 ug | INTRAMUSCULAR | Status: DC | PRN
  Administered 2021-05-03: 25 ug via INTRAVENOUS
  Administered 2021-05-03: 50 ug via INTRAVENOUS
  Administered 2021-05-03: 25 ug via INTRAVENOUS
  Administered 2021-05-03: 50 ug via INTRAVENOUS

## 2021-05-03 MED ORDER — HYDROCODONE-ACETAMINOPHEN 7.5-325 MG PO TABS: 1.0000 | ORAL_TABLET | ORAL | Status: DC | PRN

## 2021-05-03 MED ORDER — DOCUSATE SODIUM 100 MG PO CAPS
100.0000 mg | ORAL_CAPSULE | Freq: Two times a day (BID) | ORAL | Status: DC
  Administered 2021-05-03 – 2021-05-04 (×2): 100 mg via ORAL
  Filled 2021-05-03 (×2): qty 1

## 2021-05-03 MED ORDER — ACETAMINOPHEN 325 MG PO TABS
325.0000 mg | ORAL_TABLET | Freq: Four times a day (QID) | ORAL | Status: DC | PRN
Start: 2021-05-04 — End: 2021-05-04

## 2021-05-03 MED ORDER — BENAZEPRIL HCL 20 MG PO TABS
20.0000 mg | ORAL_TABLET | Freq: Every day | ORAL | Status: DC
  Administered 2021-05-04: 20 mg via ORAL
  Filled 2021-05-03: qty 1

## 2021-05-03 MED ORDER — SODIUM CHLORIDE 0.9 % IV SOLN
2.0000 g | INTRAVENOUS | Status: AC
  Administered 2021-05-03: 2 g via INTRAVENOUS
  Filled 2021-05-03: qty 2

## 2021-05-03 MED ORDER — PHENYLEPHRINE HCL-NACL 10-0.9 MG/250ML-% IV SOLN
INTRAVENOUS | Status: DC | PRN
  Administered 2021-05-03: 15 ug/min via INTRAVENOUS

## 2021-05-03 MED ORDER — POTASSIUM CHLORIDE CRYS ER 20 MEQ PO TBCR
20.0000 meq | EXTENDED_RELEASE_TABLET | Freq: Every day | ORAL | Status: DC
  Administered 2021-05-04: 20 meq via ORAL
  Filled 2021-05-03: qty 1

## 2021-05-03 MED ORDER — TRANEXAMIC ACID-NACL 1000-0.7 MG/100ML-% IV SOLN
1000.0000 mg | INTRAVENOUS | Status: AC
  Administered 2021-05-03: 1000 mg via INTRAVENOUS
  Filled 2021-05-03: qty 100

## 2021-05-03 MED ORDER — HYDROMORPHONE HCL 1 MG/ML IJ SOLN
INTRAMUSCULAR | Status: AC
  Filled 2021-05-03: qty 1

## 2021-05-03 MED ORDER — TRANEXAMIC ACID-NACL 1000-0.7 MG/100ML-% IV SOLN
1000.0000 mg | Freq: Once | INTRAVENOUS | Status: AC
  Administered 2021-05-03: 1000 mg via INTRAVENOUS
  Filled 2021-05-03: qty 100

## 2021-05-03 MED ORDER — MENTHOL 3 MG MT LOZG: 1.0000 | LOZENGE | OROMUCOSAL | Status: DC | PRN

## 2021-05-03 MED ORDER — POVIDONE-IODINE 10 % EX SWAB
2.0000 "application " | Freq: Once | CUTANEOUS | Status: AC
  Administered 2021-05-03: 2 via TOPICAL

## 2021-05-03 MED ORDER — PHENYLEPHRINE HCL (PRESSORS) 10 MG/ML IV SOLN
INTRAVENOUS | Status: AC
  Filled 2021-05-03: qty 1

## 2021-05-03 MED ORDER — BUPIVACAINE IN DEXTROSE 0.75-8.25 % IT SOLN
INTRATHECAL | Status: DC | PRN
  Administered 2021-05-03: 1.6 mL via INTRATHECAL

## 2021-05-03 MED ORDER — METHOCARBAMOL 500 MG IVPB - SIMPLE MED
500.0000 mg | Freq: Four times a day (QID) | INTRAVENOUS | Status: DC | PRN
  Administered 2021-05-03: 500 mg via INTRAVENOUS
  Filled 2021-05-03: qty 50

## 2021-05-03 MED ORDER — ACETAMINOPHEN 10 MG/ML IV SOLN
1000.0000 mg | Freq: Once | INTRAVENOUS | Status: AC
  Administered 2021-05-03: 1000 mg via INTRAVENOUS

## 2021-05-03 MED ORDER — METOCLOPRAMIDE HCL 5 MG PO TABS: 5.0000 mg | ORAL_TABLET | Freq: Three times a day (TID) | ORAL | Status: DC | PRN

## 2021-05-03 MED ORDER — POLYETHYLENE GLYCOL 3350 17 G PO PACK: 17.0000 g | PACK | Freq: Every day | ORAL | Status: DC | PRN

## 2021-05-03 MED ORDER — SODIUM CHLORIDE 0.9 % IV SOLN: INTRAVENOUS | Status: DC

## 2021-05-03 MED ORDER — BISACODYL 10 MG RE SUPP: 10.0000 mg | Freq: Every day | RECTAL | Status: DC | PRN

## 2021-05-03 MED ORDER — HYDROMORPHONE HCL 1 MG/ML IJ SOLN
0.2500 mg | INTRAMUSCULAR | Status: DC | PRN
Start: 2021-05-03 — End: 2021-05-03
  Administered 2021-05-03 (×4): 0.5 mg via INTRAVENOUS

## 2021-05-03 MED ORDER — LIDOCAINE 2% (20 MG/ML) 5 ML SYRINGE
INTRAMUSCULAR | Status: DC | PRN
  Administered 2021-05-03: 50 mg via INTRAVENOUS

## 2021-05-03 MED ORDER — ONDANSETRON HCL 4 MG/2ML IJ SOLN
INTRAMUSCULAR | Status: AC
  Filled 2021-05-03: qty 2

## 2021-05-03 MED ORDER — ONDANSETRON HCL 4 MG/2ML IJ SOLN
4.0000 mg | Freq: Four times a day (QID) | INTRAMUSCULAR | Status: DC | PRN
  Administered 2021-05-03: 4 mg via INTRAVENOUS
  Filled 2021-05-03: qty 2

## 2021-05-03 MED ORDER — ACETAMINOPHEN 10 MG/ML IV SOLN
INTRAVENOUS | Status: AC
  Filled 2021-05-03: qty 100

## 2021-05-03 SURGICAL SUPPLY — 43 items
ADH SKN CLS APL DERMABOND .7 (GAUZE/BANDAGES/DRESSINGS) ×1
BAG COUNTER SPONGE SURGICOUNT (BAG) IMPLANT
BAG DECANTER FOR FLEXI CONT (MISCELLANEOUS) IMPLANT
BAG SPEC THK2 15X12 ZIP CLS (MISCELLANEOUS)
BAG SPNG CNTER NS LX DISP (BAG)
BAG ZIPLOCK 12X15 (MISCELLANEOUS) IMPLANT
BLADE SAG 18X100X1.27 (BLADE) ×2 IMPLANT
COVER PERINEAL POST (MISCELLANEOUS) ×2 IMPLANT
COVER SURGICAL LIGHT HANDLE (MISCELLANEOUS) ×2 IMPLANT
CUP ACETBLR 52 OD PINNACLE (Hips) ×2 IMPLANT
DERMABOND ADVANCED (GAUZE/BANDAGES/DRESSINGS) ×1
DERMABOND ADVANCED .7 DNX12 (GAUZE/BANDAGES/DRESSINGS) ×1 IMPLANT
DRAPE FOOT SWITCH (DRAPES) ×2 IMPLANT
DRAPE STERI IOBAN 125X83 (DRAPES) ×2 IMPLANT
DRAPE U-SHAPE 47X51 STRL (DRAPES) ×4 IMPLANT
DRESSING AQUACEL AG SP 3.5X10 (GAUZE/BANDAGES/DRESSINGS) ×1 IMPLANT
DRSG AQUACEL AG ADV 3.5X10 (GAUZE/BANDAGES/DRESSINGS) ×2 IMPLANT
DRSG AQUACEL AG SP 3.5X10 (GAUZE/BANDAGES/DRESSINGS) ×2
DURAPREP 26ML APPLICATOR (WOUND CARE) ×2 IMPLANT
ELECT REM PT RETURN 15FT ADLT (MISCELLANEOUS) ×2 IMPLANT
ELIMINATOR HOLE APEX DEPUY (Hips) ×2 IMPLANT
GLOVE SURG ENC MOIS LTX SZ6 (GLOVE) ×4 IMPLANT
GLOVE SURG UNDER LTX SZ7.5 (GLOVE) ×2 IMPLANT
GLOVE SURG UNDER POLY LF SZ6.5 (GLOVE) ×2 IMPLANT
GLOVE SURG UNDER POLY LF SZ7.5 (GLOVE) ×4 IMPLANT
GOWN STRL REUS W/TWL LRG LVL3 (GOWN DISPOSABLE) ×4 IMPLANT
HEAD CERAMIC 36 PLUS5 (Hips) ×1 IMPLANT
HOLDER FOLEY CATH W/STRAP (MISCELLANEOUS) ×2 IMPLANT
KIT TURNOVER KIT A (KITS) ×2 IMPLANT
LINER NEUTRAL 52X36MM PLUS 4 (Liner) ×1 IMPLANT
PACK ANTERIOR HIP CUSTOM (KITS) ×2 IMPLANT
PENCIL SMOKE EVACUATOR (MISCELLANEOUS) IMPLANT
SCREW 6.5MMX25MM (Screw) ×2 IMPLANT
STEM FEM ACTIS HIGH SZ3 (Stem) ×2 IMPLANT
SUT MNCRL AB 4-0 PS2 18 (SUTURE) ×2 IMPLANT
SUT STRATAFIX 0 PDS 27 VIOLET (SUTURE) ×2
SUT VIC AB 1 CT1 36 (SUTURE) ×6 IMPLANT
SUT VIC AB 2-0 CT1 27 (SUTURE) ×4
SUT VIC AB 2-0 CT1 TAPERPNT 27 (SUTURE) ×2 IMPLANT
SUTURE STRATFX 0 PDS 27 VIOLET (SUTURE) ×1 IMPLANT
TRAY FOLEY MTR SLVR 16FR STAT (SET/KITS/TRAYS/PACK) IMPLANT
TUBE SUCTION HIGH CAP CLEAR NV (SUCTIONS) ×2 IMPLANT
WATER STERILE IRR 1000ML POUR (IV SOLUTION) ×2 IMPLANT

## 2021-05-03 NOTE — Interval H&P Note (Signed)
History and Physical Interval Note:  05/03/2021 11:09 AM  Doris [name]  has presented today for surgery, with the diagnosis of Right hip osteoarthritis.  The various methods of treatment have been discussed with the patient and family. After consideration of risks, benefits and other options for treatment, the patient has consented to  Procedure(s): TOTAL HIP ARTHROPLASTY ANTERIOR APPROACH (Right) as a surgical intervention.  The patient's history has been reviewed, patient examined, no change in status, stable for surgery.  I have reviewed the patient's chart and labs.  Questions were answered to the patient's satisfaction.     Mauri Pole

## 2021-05-03 NOTE — Plan of Care (Signed)
  Problem: Education: Goal: Knowledge of General Education information will improve Description: Including pain rating scale, medication(s)/side effects and non-pharmacologic comfort measures Outcome: Progressing   Problem: Activity: Goal: Risk for activity intolerance will decrease Outcome: Progressing   Problem: Nutrition: Goal: Adequate nutrition will be maintained Outcome: Progressing   Problem: Elimination: Goal: Will not experience complications related to bowel motility Outcome: Progressing   Problem: Pain Managment: Goal: General experience of comfort will improve Outcome: Progressing   Problem: Skin Integrity: Goal: Risk for impaired skin integrity will decrease Outcome: Progressing   Problem: Education: Goal: Knowledge of the prescribed therapeutic regimen will improve Outcome: Progressing   Problem: Activity: Goal: Ability to avoid complications of mobility impairment will improve Outcome: Progressing   Problem: Pain Management: Goal: Pain level will decrease with appropriate interventions Outcome: Progressing

## 2021-05-03 NOTE — Anesthesia Procedure Notes (Signed)
Spinal  Patient location during procedure: OR Start time: 05/03/2021 12:39 PM End time: 05/03/2021 12:43 PM Reason for block: surgical anesthesia Staffing Performed: anesthesiologist  Anesthesiologist: Audry Pili, MD Preanesthetic Checklist Completed: patient identified, IV checked, risks and benefits discussed, surgical consent, monitors and equipment checked, pre-op evaluation and timeout performed Spinal Block Patient position: sitting Prep: DuraPrep Patient monitoring: heart rate, cardiac monitor, continuous pulse ox and blood pressure Approach: midline Location: L2-3 Injection technique: single-shot Needle Needle type: Pencil-Tip  Needle gauge: 22 G Additional Notes Consent was obtained prior to the procedure with all questions answered and concerns addressed. Risks including, but not limited to, bleeding, infection, nerve damage, paralysis, failed block, inadequate analgesia, allergic reaction, high spinal, itching, and headache were discussed and the patient wished to proceed. Functioning IV was confirmed and monitors were applied. Sterile prep and drape, including hand hygiene, mask, and sterile gloves were used. The patient was positioned and the spine was prepped. The skin was anesthetized with lidocaine. Free flow of clear CSF was obtained prior to injecting local anesthetic into the CSF. The spinal needle aspirated freely following injection. The needle was carefully withdrawn. The patient tolerated the procedure well.   Doris Don, MD

## 2021-05-03 NOTE — Discharge Instructions (Signed)

## 2021-05-03 NOTE — Op Note (Signed)
NAME:  Doris [name]                ACCOUNT NO.: [id number]      MEDICAL RECORD NO.: [id number]      FACILITY:  [facility]      PHYSICIAN:  Mauri Pole  DATE OF BIRTH:  Sep 11, 1946     DATE OF PROCEDURE:  05/03/2021                                 OPERATIVE REPORT         PREOPERATIVE DIAGNOSIS: Right  hip osteoarthritis.      POSTOPERATIVE DIAGNOSIS:  Right hip osteoarthritis.      PROCEDURE:  Right total hip replacement through an anterior approach   utilizing DePuy THR system, component size 7mm pinnacle cup, a size 36+4 neutral   Altrex liner, a size 3Hi Actis stem with a 36+5 delta ceramic   ball.      SURGEON:  Pietro Cassis. Alvan Dame, M.D.      ASSISTANT:  Costella Hatcher, PA-C     ANESTHESIA:  Spinal.      SPECIMENS:  None.      COMPLICATIONS:  None.      BLOOD LOSS:  300 cc     DRAINS:  None.      INDICATION OF THE PROCEDURE:  Doris [name] is a 75 y.o. female who had   presented to office for evaluation of right hip pain.  Radiographs revealed   progressive degenerative changes with bone-on-bone   articulation of the  hip joint, including subchondral cystic changes and osteophytes.  The patient had painful limited range of   motion significantly affecting their overall quality of life and function.  The patient was failing to    respond to conservative measures including medications and/or injections and activity modification and at this point was ready   to proceed with more definitive measures.  Consent was obtained for   benefit of pain relief.  Specific risks of infection, DVT, component   failure, dislocation, neurovascular injury, and need for revision surgery were reviewed in the office as well discussion of   the anterior versus posterior approach were reviewed.     PROCEDURE IN DETAIL:  The patient was brought to operative theater.   Once adequate anesthesia, preoperative antibiotics, 2 gm of Ancef, 1 gm of Tranexamic Acid, and 10  mg of Decadron were administered, the patient was positioned supine on the [employer] table.  Once the patient was safely positioned with adequate padding of boney prominences we predraped out the hip, and used fluoroscopy to confirm orientation of the pelvis.      The right hip was then prepped and draped from proximal iliac crest to   mid thigh with a shower curtain technique.      Time-out was performed identifying the patient, planned procedure, and the appropriate extremity.     An incision was then made 2 cm lateral to the   anterior superior iliac spine extending over the orientation of the   tensor fascia lata muscle and sharp dissection was carried down to the   fascia of the muscle.      The fascia was then incised.  The muscle belly was identified and swept   laterally and retractor placed along the superior neck.  Following   cauterization of the circumflex vessels and removing some pericapsular  fat, a second cobra retractor was placed on the inferior neck.  A T-capsulotomy was made along the line of the   superior neck to the trochanteric fossa, then extended proximally and   distally.  Tag sutures were placed and the retractors were then placed   intracapsular.  We then identified the trochanteric fossa and   orientation of my neck cut and then made a neck osteotomy with the femur on traction.  The femoral   head was removed without difficulty or complication.  Traction was let   off and retractors were placed posterior and anterior around the   acetabulum.      The labrum and foveal tissue were debrided.  I began reaming with a 44 mm   reamer and reamed up to 51 mm reamer with good bony bed preparation and a 52 mm  cup was chosen.  The final 52 mm Pinnacle cup was then impacted under fluoroscopy to confirm the depth of penetration and orientation with respect to   Abduction and forward flexion.  A screw was placed into the ilium followed by the hole eliminator.  The final    36+4 neutral Altrex liner was impacted with good visualized rim fit.  The cup was positioned anatomically within the acetabular portion of the pelvis.      At this point, the femur was rolled to 100 degrees.  Further capsule was   released off the inferior aspect of the femoral neck.  I then   released the superior capsule proximally.  With the leg in a neutral position the hook was placed laterally   along the femur under the vastus lateralis origin and elevated manually and then held in position using the hook attachment on the bed.  The leg was then extended and adducted with the leg rolled to 100   degrees of external rotation.  Retractors were placed along the medial calcar and posteriorly over the greater trochanter.  Once the proximal femur was fully   exposed, I used a box osteotome to set orientation.  I then began   broaching with the starting chili pepper broach and passed this by hand and then broached up to 3.  With the 3 broach in place I chose a high offset neck and did several trial reductions.  The offset was appropriate, leg lengths   appeared to be equal best matched with the +5 head ball trial confirmed radiographically.   Given these findings, I went ahead and dislocated the hip, repositioned all   retractors and positioned the right hip in the extended and abducted position.  The final 3 Hi Actis stem was   chosen and it was impacted down to the level of neck cut.  Based on this   and the trial reductions, a final 36+5 delta ceramic ball was chosen and   impacted onto a clean and dry trunnion, and the hip was reduced.  The   hip had been irrigated throughout the case again at this point.  I did   reapproximate the superior capsular leaflet to the anterior leaflet   using #1 Vicryl.  The fascia of the   tensor fascia lata muscle was then reapproximated using #1 Vicryl and #0 Stratafix sutures.  The   remaining wound was closed with 2-0 Vicryl and running 4-0 Monocryl.    The hip was cleaned, dried, and dressed sterilely using Dermabond and   Aquacel dressing.  The patient was then brought   to recovery room in  stable condition tolerating the procedure well.    Costella Hatcher, PA-C was present for the entirety of the case involved from   preoperative positioning, perioperative retractor management, general   facilitation of the case, as well as primary wound closure as assistant.            Pietro Cassis Alvan Dame, M.D.        05/03/2021 12:31 PM

## 2021-05-03 NOTE — Anesthesia Postprocedure Evaluation (Signed)
Anesthesia Post Note  Patient: Doris [name]  Procedure(s) Performed: TOTAL HIP ARTHROPLASTY ANTERIOR APPROACH (Right: Hip)     Patient location during evaluation: PACU Anesthesia Type: Spinal Level of consciousness: awake and alert Pain management: pain level controlled Vital Signs Assessment: post-procedure vital signs reviewed and stable Respiratory status: spontaneous breathing and respiratory function stable Cardiovascular status: blood pressure returned to baseline and stable Postop Assessment: spinal receding and no apparent nausea or vomiting Anesthetic complications: no   No notable events documented.  Last Vitals:  Vitals:   05/03/21 1515 05/03/21 1530  BP: 115/79 122/68  Pulse: 76 83  Resp: 14 12  Temp:    SpO2: 96% 100%    Last Pain:  Vitals:   05/03/21 1530  PainSc: [place]

## 2021-05-03 NOTE — Anesthesia Preprocedure Evaluation (Addendum)
Anesthesia Evaluation  Patient identified by MRN, date of birth, ID band Patient awake    Reviewed: Allergy & Precautions, NPO status , Patient's Chart, lab work & pertinent test results  History of Anesthesia Complications Negative for: history of anesthetic complications  Airway Mallampati: II  TM Distance: >3 FB Neck ROM: Full    Dental  (+) Dental Advisory Given   Pulmonary neg pulmonary ROS,    Pulmonary exam normal        Cardiovascular hypertension, Pt. on medications Normal cardiovascular exam     Neuro/Psych  Headaches, PSYCHIATRIC DISORDERS Depression    GI/Hepatic negative GI ROS, Neg liver ROS,   Endo/Other   Pre-DM   Renal/GU negative Renal ROS     Musculoskeletal  (+) Arthritis ,   Abdominal   Peds  Hematology negative hematology ROS (+)  Plt 290k    Anesthesia Other Findings Covid test negative   Reproductive/Obstetrics                            Anesthesia Physical Anesthesia Plan  ASA: 2  Anesthesia Plan: Spinal   Post-op Pain Management:    Induction:   PONV Risk Score and Plan: 2 and Treatment may vary due to age or medical condition and Propofol infusion  Airway Management Planned: Natural Airway and Simple Face Mask  Additional Equipment: None  Intra-op Plan:   Post-operative Plan:   Informed Consent: I have reviewed the patients History and Physical, chart, labs and discussed the procedure including the risks, benefits and alternatives for the proposed anesthesia with the patient or authorized representative who has indicated his/her understanding and acceptance.       Plan Discussed with: CRNA and Anesthesiologist  Anesthesia Plan Comments: (Labs reviewed, platelets acceptable. Discussed risks and benefits of spinal, including spinal/epidural hematoma, infection, failed block, and PDPH. Patient expressed understanding and wished to proceed. )        Anesthesia Quick Evaluation

## 2021-05-03 NOTE — Anesthesia Procedure Notes (Signed)
Procedure Name: MAC Date/Time: 05/03/2021 1:01 PM Performed by: Claudia Desanctis, CRNA Pre-anesthesia Checklist: Patient identified, Emergency Drugs available, Suction available and Patient being monitored Patient Re-evaluated:Patient Re-evaluated prior to induction Oxygen Delivery Method: Simple face mask

## 2021-05-03 NOTE — Transfer of Care (Signed)
Immediate Anesthesia Transfer of Care Note  Patient: Doris [name]  Procedure(s) Performed: TOTAL HIP ARTHROPLASTY ANTERIOR APPROACH (Right: Hip)  Patient Location: PACU  Anesthesia Type:Spinal  Level of Consciousness: awake, alert , oriented and patient cooperative  Airway & Oxygen Therapy: Patient Spontanous Breathing and Patient connected to face mask  Post-op Assessment: Report given to RN and Post -op Vital signs reviewed and stable  Post vital signs: Reviewed and stable  Last Vitals:  Vitals Value Taken Time  BP 95/58 05/03/21 1420  Temp    Pulse 81 05/03/21 1422  Resp 18 05/03/21 1422  SpO2 93 % 05/03/21 1422  Vitals shown include unvalidated device data.  Last Pain:  Vitals:   05/03/21 1049  PainSc: 3       Patients Stated Pain Goal: 2 (79/98/72 1587)  Complications: No notable events documented.

## 2021-05-04 ENCOUNTER — Encounter (HOSPITAL_COMMUNITY): Payer: Self-pay | Admitting: Orthopedic Surgery

## 2021-05-04 DIAGNOSIS — R7303 Prediabetes: Secondary | ICD-10-CM | POA: Diagnosis not present

## 2021-05-04 DIAGNOSIS — M1611 Unilateral primary osteoarthritis, right hip: Secondary | ICD-10-CM | POA: Diagnosis not present

## 2021-05-04 DIAGNOSIS — Z7982 Long term (current) use of aspirin: Secondary | ICD-10-CM | POA: Diagnosis not present

## 2021-05-04 DIAGNOSIS — Z79899 Other long term (current) drug therapy: Secondary | ICD-10-CM | POA: Diagnosis not present

## 2021-05-04 DIAGNOSIS — I10 Essential (primary) hypertension: Secondary | ICD-10-CM | POA: Diagnosis not present

## 2021-05-04 DIAGNOSIS — Z96642 Presence of left artificial hip joint: Secondary | ICD-10-CM | POA: Diagnosis not present

## 2021-05-04 LAB — CBC
HCT: 30.7 % — ABNORMAL LOW (ref 36.0–46.0)
Hemoglobin: 9.9 g/dL — ABNORMAL LOW (ref 12.0–15.0)
MCH: 29.7 pg (ref 26.0–34.0)
MCHC: 32.2 g/dL (ref 30.0–36.0)
MCV: 92.2 fL (ref 80.0–100.0)
Platelets: 210 10*3/uL (ref 150–400)
RBC: 3.33 MIL/uL — ABNORMAL LOW (ref 3.87–5.11)
RDW: 13.4 % (ref 11.5–15.5)
WBC: 18.4 10*3/uL — ABNORMAL HIGH (ref 4.0–10.5)
nRBC: 0 % (ref 0.0–0.2)

## 2021-05-04 LAB — BASIC METABOLIC PANEL
Anion gap: 7 (ref 5–15)
BUN: 14 mg/dL (ref 8–23)
CO2: 28 mmol/L (ref 22–32)
Calcium: 10.2 mg/dL (ref 8.9–10.3)
Chloride: 103 mmol/L (ref 98–111)
Creatinine, Ser: 0.46 mg/dL (ref 0.44–1.00)
GFR, Estimated: >60 mL/min (ref >60)
Glucose, Bld: 136 mg/dL — ABNORMAL HIGH (ref 70–99)
Potassium: 3.9 mmol/L (ref 3.5–5.1)
Sodium: 138 mmol/L (ref 135–145)

## 2021-05-04 MED ORDER — HYDROCODONE-ACETAMINOPHEN 5-325 MG PO TABS: 1.0000 | ORAL_TABLET | Freq: Four times a day (QID) | ORAL | 0 refills | Status: DC | PRN

## 2021-05-04 MED ORDER — METHOCARBAMOL 500 MG PO TABS: 500.0000 mg | ORAL_TABLET | Freq: Four times a day (QID) | ORAL | 0 refills | Status: DC | PRN

## 2021-05-04 MED ORDER — POLYETHYLENE GLYCOL 3350 17 G PO PACK: 17.0000 g | PACK | Freq: Every day | ORAL | 0 refills | Status: AC | PRN

## 2021-05-04 MED ORDER — DOCUSATE SODIUM 100 MG PO CAPS: 100.0000 mg | ORAL_CAPSULE | Freq: Two times a day (BID) | ORAL | 0 refills | Status: DC

## 2021-05-04 MED ORDER — ASPIRIN 81 MG PO CHEW: 81.0000 mg | CHEWABLE_TABLET | Freq: Two times a day (BID) | ORAL | 0 refills | Status: AC

## 2021-05-04 NOTE — Progress Notes (Signed)
   Subjective: 1 Day Post-Op Procedure(s) (LRB): TOTAL HIP ARTHROPLASTY ANTERIOR APPROACH (Right) Patient reports pain as mild.   Patient seen in rounds for Dr. Alvan Dame. Patient is well, and has had no acute complaints or problems. Foley catheter removed, positive flatus. She has not worked with PT yet.  We will start therapy today.   Objective: Vital signs in last 24 hours: Temp:  [97.6 F (36.4 C)-99.6 F (37.6 C)] 99.6 F (37.6 C) (07/13 0533) Pulse Rate:  [74-106] 92 (07/13 0533) Resp:  [0-18] 17 (07/13 0533) BP: (86-155)/(58-97) 131/67 (07/13 0533) SpO2:  [90 %-100 %] 99 % (07/13 0141) Weight:  [64 kg] 64 kg (07/12 1049)  Intake/Output from previous day:  Intake/Output Summary (Last 24 hours) at 05/04/2021 0853 Last data filed at 05/04/2021 0600 Gross per 24 hour  Intake 2087.62 ml  Output 1375 ml  Net 712.62 ml     Intake/Output this shift: No intake/output data recorded.  Labs: Recent Labs    05/04/21 0324  HGB 9.9*   Recent Labs    05/04/21 0324  WBC 18.4*  RBC 3.33*  HCT 30.7*  PLT 210   Recent Labs    05/04/21 0324  NA 138  K 3.9  CL 103  CO2 28  BUN 14  CREATININE 0.46  GLUCOSE 136*  CALCIUM 10.2   No results for input(s): LABPT, INR in the last 72 hours.  Exam: General - Patient is Alert and Oriented Extremity - Neurologically intact Sensation intact distally Intact pulses distally Dorsiflexion/Plantar flexion intact Dressing - dressing C/D/I Motor Function - intact, moving foot and toes well on exam.   Past Medical History:  Diagnosis Date   Abnormal uterine bleeding    Condyloma    Degenerative disc disease    C-spine, back, hip surgery   Depression    Dysplasia of cervix, low grade (CIN 1)    Hypercalcemia    Hyperlipidemia    Hypertension    Migraine    MRSA (methicillin resistant Staphylococcus aureus) 10/24/2007   after back surg- #3 I&D surgewries after   Parathyroid disease ([place])    hyperparathyroid   Pneumonia     Pre-diabetes     Assessment/Plan: 1 Day Post-Op Procedure(s) (LRB): TOTAL HIP ARTHROPLASTY ANTERIOR APPROACH (Right) Active Problems:   S/P right total hip arthroplasty  Estimated body mass index is 26.21 kg/m as calculated from the following:   Height as of this encounter: 5' 1.5" (1.562 m).   Weight as of this encounter: 64 kg. Advance diet Up with therapy D/C IV fluids  DVT Prophylaxis - Aspirin Weight bearing as tolerated.  Plan is to go Home after hospital stay. Plan to get up with PT today. Discharge following 1-2 sessions as long as she is meeting her goals. Follow up in the office in 2 weeks.   Griffith Citron, PA-C Orthopedic Surgery [phone] 05/04/2021, 8:53 AM

## 2021-05-04 NOTE — Evaluation (Signed)
Physical Therapy Evaluation Patient Details Name: Doris [name] MRN: [id number] DOB: 1946-09-13 Today's Date: 05/04/2021   History of Present Illness  75 yo female s/p R THA-DA 05/03/21. Hx of MRSA  Clinical Impression  On eval, pt required Min A for mobility. She walked ~85 feet with a RW. Mild-mod pain with activity. Pt tolerated activity well. Discussed d/c plan with pt and family-there is some uneasiness on family's part about patient's plan. Pt wants to return to her home where she lives alone. Family is concerned about her safety. Will plan to practice stair negotiation this afternoon.     Follow Up Recommendations Follow surgeon's recommendation for DC plan and follow-up therapies    Equipment Recommendations  None recommended by PT    Recommendations for Other Services       Precautions / Restrictions Precautions Precautions: Fall Restrictions Weight Bearing Restrictions: No Other Position/Activity Restrictions: WBAT      Mobility  Bed Mobility Overal bed mobility: Needs Assistance Bed Mobility: Supine to Sit     Supine to sit: Min assist;HOB elevated     General bed mobility comments: Assist for R LE. Increased time. Cues for safety, technique.    Transfers Overall transfer level: Needs assistance Equipment used: Rolling walker (2 wheeled) Transfers: Sit to/from Stand Sit to Stand: Min assist         General transfer comment: Assist to steady. Cues for safety, hand placement.  Ambulation/Gait Ambulation/Gait assistance: Min assist Gait Distance (Feet): 85 Feet Assistive device: Rolling walker (2 wheeled) Gait Pattern/deviations: Step-through pattern;Decreased stride length     General Gait Details: Assist to stabilize throughout distance. Cues for safety, sequence, RW proximity.  Stairs            Wheelchair Mobility    Modified Rankin (Stroke Patients Only)       Balance Overall balance assessment: Needs assistance          Standing balance support: Bilateral upper extremity supported Standing balance-Leahy Scale: Poor                               Pertinent Vitals/Pain Pain Assessment: 0-10 Pain Score: 5  Pain Location: R hip/thigh Pain Descriptors / Indicators: Discomfort;Sore Pain Intervention(s): Limited activity within patient's tolerance;Monitored during session;Repositioned    Home Living Family/patient expects to be discharged to:: Private residence Living Arrangements: Alone   Type of Home: House Home Access: Stairs to enter   [employer] of Steps: 1 Home Layout: Two level Home Equipment: Walker - 2 wheels;Crutches;Cane - single point;Bedside commode      Prior Function Level of Independence: Independent               Hand Dominance        Extremity/Trunk Assessment   Upper Extremity Assessment Upper Extremity Assessment: Overall WFL for tasks assessed    Lower Extremity Assessment Lower Extremity Assessment: Generalized weakness    Cervical / Trunk Assessment Cervical / Trunk Assessment: Normal  Communication   Communication: No difficulties  Cognition Arousal/Alertness: Awake/alert Behavior During Therapy: WFL for tasks assessed/performed Overall Cognitive Status: Within Functional Limits for tasks assessed                                        General Comments      Exercises Total Joint Exercises Ankle Circles/Pumps: AROM;Both;10 reps [employer]  Sets: AROM;Both;10 reps Heel Slides: AAROM;Right;10 reps Hip ABduction/ADduction: AAROM;Right;10 reps   Assessment/Plan    PT Assessment Patient needs continued PT services  PT Problem List Decreased strength;Decreased mobility;Decreased range of motion;Decreased activity tolerance;Decreased balance;Decreased knowledge of use of DME;Pain       PT Treatment Interventions DME instruction;Gait training;Therapeutic exercise;Balance training;Stair training;Functional mobility  training;Therapeutic activities;Patient/family education    PT Goals (Current goals can be found in the Care Plan section)  Acute Rehab PT Goals Patient Stated Goal: home! PT Goal Formulation: With patient/family Time For Goal Achievement: 05/18/21 Potential to Achieve Goals: Good    Frequency 7X/week   Barriers to discharge        Co-evaluation               AM-PAC PT "6 Clicks" Mobility  Outcome Measure Help needed turning from your back to your side while in a flat bed without using bedrails?: A Little Help needed moving from lying on your back to sitting on the side of a flat bed without using bedrails?: A Little Help needed moving to and from a bed to a chair (including a wheelchair)?: A Little Help needed standing up from a chair using your arms (e.g., wheelchair or bedside chair)?: A Little Help needed to walk in hospital room?: A Little Help needed climbing 3-5 steps with a railing? : A Little 6 Click Score: 18    End of Session Equipment Utilized During Treatment: Gait belt Activity Tolerance: Patient tolerated treatment well Patient left: in chair;with call bell/phone within reach;with family/visitor present   PT Visit Diagnosis: Other abnormalities of gait and mobility (R26.89);Pain Pain - Right/Left: Right Pain - part of body: Hip    Time: 4235-3614 PT Time Calculation (min) (ACUTE ONLY): 23 min   Charges:   PT Evaluation $PT Eval Low Complexity: 1 Low PT Treatments $Gait Training: 8-22 mins        Doreatha Massed, PT Acute Rehabilitation  Office: [phone] Pager: [phone]

## 2021-05-04 NOTE — Progress Notes (Signed)
Physical Therapy Treatment Patient Details Name: Doris [name] MRN: [id number] DOB: 1946-05-25 Today's Date: 05/04/2021    History of Present Illness 75 yo female s/p R THA-DA 05/03/21. Hx of MRSA    PT Comments    Progressing with mobility. Reviewed/practiced bed mobility, exercises, gait training, and stair training. Issued handout for HEP-pt to perform 2x/day.  All education completed. Pt will have assistance from son and granddaughter. Okay to d/c from PT standpoint.    Follow Up Recommendations  Follow surgeon's recommendation for DC plan and follow-up therapies; Intermittent Supervision/Assistance     Equipment Recommendations  None recommended by PT    Recommendations for Other Services       Precautions / Restrictions Precautions Precautions: Fall Restrictions Weight Bearing Restrictions: No Other Position/Activity Restrictions: WBAT    Mobility  Bed Mobility Overal bed mobility: Needs Assistance Bed Mobility: Supine to Sit;Sit to Supine     Supine to sit: Min assist Sit to supine: Min assist   General bed mobility comments: Assist for R LE. Increased time. Cues for safety, technique. Practiced with using gait belt-still has some difficulty-family will likely have to help for a few days    Transfers Overall transfer level: Needs assistance Equipment used: Rolling walker (2 wheeled) Transfers: Sit to/from Stand Sit to Stand: Min guard         General transfer comment: Assist to steady. Cues for safety, hand placement.  Ambulation/Gait Ambulation/Gait assistance: Min guard Gait Distance (Feet): 75 Feet Assistive device: Rolling walker (2 wheeled) Gait Pattern/deviations: Step-through pattern;Decreased stride length     General Gait Details: Min guard for safety. Cues for safety, sequence, RW proximity.   Stairs Stairs: Yes Stairs assistance: Min guard Stair Management: Step to pattern;Forwards;Two rails;One rail Right Number of Stairs: 5 (5  steps x 1, 1 step x 1) General stair comments: Practiced x 1 with 2 rails, x 1 with 1 rail, and 1 step with RW. Cues for safety, technique, sequence. Pt had to use 1 rail + wall to simulate home setup when 2 rails goes to 1 rail.   Wheelchair Mobility    Modified Rankin (Stroke Patients Only)       Balance Overall balance assessment: Needs assistance         Standing balance support: Bilateral upper extremity supported Standing balance-Leahy Scale: Poor                              Cognition Arousal/Alertness: Awake/alert Behavior During Therapy: WFL for tasks assessed/performed Overall Cognitive Status: Within Functional Limits for tasks assessed                                        Exercises Total Joint Exercises   Hip ABduction/ADduction: AROM;Right;5 reps;Standing Long Arc Quad: AROM;Right;5 reps;Seated Knee Flexion: AROM;Right;5 reps;Standing Marching in Standing: AROM;Both;5 reps;Standing    General Comments        Pertinent Vitals/Pain Pain Assessment: 0-10 Pain Score: 6  Pain Location: R hip/thigh Pain Descriptors / Indicators: Discomfort;Sore Pain Intervention(s): Monitored during session;Repositioned    Home Living                      Prior Function            PT Goals (current goals can now be found in the care plan section) Acute Rehab  PT Goals Patient Stated Goal: home! PT Goal Formulation: With patient/family Time For Goal Achievement: 05/18/21 Potential to Achieve Goals: Good Progress towards PT goals: Progressing toward goals    Frequency    7X/week      PT Plan Current plan remains appropriate    Co-evaluation              AM-PAC PT "6 Clicks" Mobility   Outcome Measure  Help needed turning from your back to your side while in a flat bed without using bedrails?: A Little Help needed moving from lying on your back to sitting on the side of a flat bed without using bedrails?: A  Little Help needed moving to and from a bed to a chair (including a wheelchair)?: A Little Help needed standing up from a chair using your arms (e.g., wheelchair or bedside chair)?: A Little Help needed to walk in hospital room?: A Little Help needed climbing 3-5 steps with a railing? : A Little 6 Click Score: 18    End of Session Equipment Utilized During Treatment: Gait belt Activity Tolerance: Patient tolerated treatment well Patient left: in bed;with call bell/phone within reach;with family/visitor present   PT Visit Diagnosis: Other abnormalities of gait and mobility (R26.89) Pain - Right/Left: Right Pain - part of body: Hip     Time: 2761-8485 PT Time Calculation (min) (ACUTE ONLY): 29 min  Charges:  $Gait Training: 8-22 mins $Therapeutic Exercise: 8-22 mins                        Doreatha Massed, PT Acute Rehabilitation  Office: [phone] Pager: [phone]

## 2021-05-04 NOTE — Care Plan (Signed)
Ortho Bundle Case Management Note  Patient Details  Name: DESTANY SEVERNS MRN: [id number] Date of Birth: 05/19/1946  R THA on 05-03-21 DCP:  Home with granddtr.  2 story home with 1-2 ste. DME:  No needs.  Has a RW and 3-in-1 PT: HEP                   DME Arranged:  N/A DME Agency:  NA  HH Arranged:  NA HH Agency:  NA  Additional Comments: Please contact me with any questions of if this plan should need to change.  Marianne Sofia, RN,CCM [facility]  [phone] 05/04/2021, 9:37 AM

## 2021-05-04 NOTE — TOC Transition Note (Signed)
Transition of Care [facility]) - CM/SW Discharge Note  Patient Details  Name: Doris [name] MRN: [id number] Date of Birth: 21-Nov-1945  Transition of Care [facility]) CM/SW Contact:  Sherie Don, LCSW Phone Number: 05/04/2021, 12:28 PM  Clinical Narrative: CSW met with patient and family to review discharge plan and needs. Patient is expected to discharge home with an HEP. Patient has a rolling walker and 3N1 at home, so there are no DME needs at this time. TOC signing off.  Final next level of care: Home/Self Care Barriers to Discharge: No Barriers Identified  Patient Goals and CMS Choice Patient states their goals for this hospitalization and ongoing recovery are:: Discharge home with HEP CMS Medicare.gov Compare Post Acute Care list provided to:: Patient Choice offered to / list presented to : Patient  Discharge Plan and Services         DME Arranged: N/A DME Agency: NA HH Arranged: NA [place] Agency: NA  Readmission Risk Interventions No flowsheet data found.

## 2021-05-18 NOTE — Discharge Summary (Signed)
Physician Discharge Summary   Patient ID: Doris [name] MRN: [id number] DOB/AGE: 02-02-1946 75 y.o.  Admit date: 05/03/2021 Discharge date: 05/04/2021  Primary Diagnosis: Right  hip osteoarthritis.  Admission Diagnoses:  Past Medical History:  Diagnosis Date   Abnormal uterine bleeding    Condyloma    Degenerative disc disease    C-spine, back, hip surgery   Depression    Dysplasia of cervix, low grade (CIN 1)    Hypercalcemia    Hyperlipidemia    Hypertension    Migraine    MRSA (methicillin resistant Staphylococcus aureus) 10/24/2007   after back surg- #3 I&D surgewries after   Parathyroid disease ([place])    hyperparathyroid   Pneumonia    Pre-diabetes    Discharge Diagnoses:   Active Problems:   S/P right total hip arthroplasty  Estimated body mass index is 26.21 kg/m as calculated from the following:   Height as of this encounter: 5' 1.5" (1.562 m).   Weight as of this encounter: 64 kg.  Procedure:  Procedure(s) (LRB): TOTAL HIP ARTHROPLASTY ANTERIOR APPROACH (Right)   Consults: None  HPI:  Doris [name] is a 75 y.o. female who had  presented to office for evaluation of right hip pain.  Radiographs revealed  progressive degenerative changes with bone-on-bone  articulation of the  hip joint, including subchondral cystic changes and osteophytes.  The patient had painful limited range of  motion significantly affecting their overall quality of life and function.  The patient was failing to    respond to conservative measures including medications and/or injections and activity modification and at this point was ready  to proceed with more definitive measures.  Consent was obtained for  benefit of pain relief.  Specific risks of infection, DVT, component  failure, dislocation, neurovascular injury, and need for revision surgery were reviewed in the office as well discussion of  the anterior versus posterior approach were reviewed.  Laboratory Data: Admission  on 05/03/2021, Discharged on 05/04/2021  Component Date Value Ref Range Status   WBC 05/04/2021 18.4 (A) 4.0 - 10.5 K/uL Final   RBC 05/04/2021 3.33 (A) 3.87 - 5.11 MIL/uL Final   Hemoglobin 05/04/2021 9.9 (A) 12.0 - 15.0 g/dL Final   HCT 05/04/2021 30.7 (A) 36.0 - 46.0 % Final   MCV 05/04/2021 92.2  80.0 - 100.0 fL Final   MCH 05/04/2021 29.7  26.0 - 34.0 pg Final   MCHC 05/04/2021 32.2  30.0 - 36.0 g/dL Final   RDW 05/04/2021 13.4  11.5 - 15.5 % Final   Platelets 05/04/2021 210  150 - 400 K/uL Final   nRBC 05/04/2021 0.0  0.0 - 0.2 % Final   Performed at [facility], [place] [address], [place], [place] [address]   Sodium 05/04/2021 138  135 - 145 mmol/L Final   Potassium 05/04/2021 3.9  3.5 - 5.1 mmol/L Final   Chloride 05/04/2021 103  98 - 111 mmol/L Final   CO2 05/04/2021 28  22 - 32 mmol/L Final   Glucose, Bld 05/04/2021 136 (A) 70 - 99 mg/dL Final   Glucose reference range applies only to samples taken after fasting for at least 8 hours.   BUN 05/04/2021 14  8 - 23 mg/dL Final   Creatinine, Ser 05/04/2021 0.46  0.44 - 1.00 mg/dL Final   Calcium 05/04/2021 10.2  8.9 - 10.3 mg/dL Final   GFR, Estimated 05/04/2021 >60  >60 mL/min Final   Comment: (NOTE) Calculated using the CKD-EPI Creatinine Equation (2021)  Anion gap 05/04/2021 7  5 - 15 Final   Performed at [facility], [place] [address], [place], [place] [address]  Hospital Outpatient Visit on 04/29/2021  Component Date Value Ref Range Status   SARS Coronavirus 2 04/29/2021 NEGATIVE  NEGATIVE Final   Comment: (NOTE) SARS-CoV-2 target nucleic acids are NOT DETECTED.  The SARS-CoV-2 RNA is generally detectable in upper and lower respiratory specimens during the acute phase of infection. Negative results do not preclude SARS-CoV-2 infection, do not rule out co-infections with other pathogens, and should not be used as the sole basis for treatment or other patient management  decisions. Negative results must be combined with clinical observations, patient history, and epidemiological information. The expected result is Negative.  Fact Sheet for Patients: SugarRoll.be  Fact Sheet for Healthcare Providers: https://www.woods-mathews.com/  This test is not yet approved or cleared by the [place] FDA and  has been authorized for detection and/or diagnosis of SARS-CoV-2 by FDA under an Emergency Use Authorization (EUA). This EUA will remain  in effect (meaning this test can be used) for the duration of the COVID-19 declaration under Se                          ction 564(b)(1) of the Act, 21 U.S.C. section 360bbb-3(b)(1), unless the authorization is terminated or revoked sooner.  Performed at [facility], [place] [address], [place], [place] [address]   Hospital Outpatient Visit on 04/21/2021  Component Date Value Ref Range Status   MRSA, PCR 04/21/2021 NEGATIVE  NEGATIVE Final   Staphylococcus aureus 04/21/2021 POSITIVE (A) NEGATIVE Final   Comment: (NOTE) The Xpert SA Assay (FDA approved for NASAL specimens in patients 21 years of age and older), is one component of a comprehensive surveillance program. It is not intended to diagnose infection nor to guide or monitor treatment. Performed at [facility], [place] [address], [place], [place] [address]    WBC 04/21/2021 10.8 (A) 4.0 - 10.5 K/uL Final   RBC 04/21/2021 4.94  3.87 - 5.11 MIL/uL Final   Hemoglobin 04/21/2021 14.5  12.0 - 15.0 g/dL Final   HCT 04/21/2021 44.9  36.0 - 46.0 % Final   MCV 04/21/2021 90.9  80.0 - 100.0 fL Final   MCH 04/21/2021 29.4  26.0 - 34.0 pg Final   MCHC 04/21/2021 32.3  30.0 - 36.0 g/dL Final   RDW 04/21/2021 14.0  11.5 - 15.5 % Final   Platelets 04/21/2021 290  150 - 400 K/uL Final   nRBC 04/21/2021 0.0  0.0 - 0.2 % Final   Performed at [facility], [place] [address], [place], [place]  [address]   Sodium 04/21/2021 143  135 - 145 mmol/L Final   Potassium 04/21/2021 3.9  3.5 - 5.1 mmol/L Final   Chloride 04/21/2021 107  98 - 111 mmol/L Final   CO2 04/21/2021 28  22 - 32 mmol/L Final   Glucose, Bld 04/21/2021 99  70 - 99 mg/dL Final   Glucose reference range applies only to samples taken after fasting for at least 8 hours.   BUN 04/21/2021 16  8 - 23 mg/dL Final   Creatinine, Ser 04/21/2021 0.53  0.44 - 1.00 mg/dL Final   Calcium 04/21/2021 12.1 (A) 8.9 - 10.3 mg/dL Final   Total Protein 04/21/2021 8.4 (A) 6.5 - 8.1 g/dL Final   Albumin 04/21/2021 4.6  3.5 - 5.0 g/dL Final   AST 04/21/2021 21  15 - 41 U/L Final   ALT 04/21/2021 21  0 - 44 U/L Final   Alkaline Phosphatase 04/21/2021 62  38 - 126 U/L Final   Total Bilirubin 04/21/2021 0.6  0.3 - 1.2 mg/dL Final   GFR, Estimated 04/21/2021 >60  >60 mL/min Final   Comment: (NOTE) Calculated using the CKD-EPI Creatinine Equation (2021)    Anion gap 04/21/2021 8  5 - 15 Final   Performed at [facility], [place] [address] [place], [place] [address]   ABO/RH(D) 04/21/2021 O NEG   Final   Antibody Screen 04/21/2021 NEG   Final   Sample Expiration 04/21/2021 05/05/2021,2359   Final   Extend sample reason 04/21/2021    Final                   Value:NO TRANSFUSIONS OR PREGNANCY IN THE PAST 3 MONTHS Performed at [place] [address], [place], [place] [address]    Hgb A1c MFr Bld 04/21/2021 5.8 (A) 4.8 - 5.6 % Final   Comment: (NOTE)         Prediabetes: 5.7 - 6.4         Diabetes: >6.4         Glycemic control for adults with diabetes: <7.0    Mean Plasma Glucose 04/21/2021 120  mg/dL Final   Comment: (NOTE) Performed At: [facility] [place], [place] [id number] Rush Farmer MD [id number]      X-Rays:DG Pelvis Portable  Result Date: 05/03/2021 CLINICAL DATA:  Post RIGHT hip arthroplasty EXAM: PORTABLE PELVIS 1-2 VIEWS COMPARISON:  Portable exam 1447  hours compared to earlier intraoperative images of 05/03/2021 FINDINGS: BILATERAL hip prostheses identified. Diffuse osseous demineralization. No fracture, dislocation, or bone destruction. Prior lower lumbar fusion. IMPRESSION: BILATERAL hip prostheses. No acute osseous abnormalities. Electronically Signed   By: Lavonia Dana M.D.   On: 05/03/2021 15:34   DG C-Arm 1-60 Min-No Report  Result Date: 05/03/2021 Fluoroscopy was utilized by the requesting physician.  No radiographic interpretation.   DG HIP OPERATIVE UNILAT W OR W/O PELVIS RIGHT  Result Date: 05/03/2021 CLINICAL DATA:  Right total hip replacement. EXAM: OPERATIVE RIGHT HIP (WITH PELVIS IF PERFORMED) 2 VIEWS TECHNIQUE: Fluoroscopic spot image(s) were submitted for interpretation post-operatively. COMPARISON:  None. FINDINGS: Two C-arm fluoroscopic images were obtained intraoperatively and submitted for post operative interpretation. These images demonstrate partial changes of right total hip arthroplasty. The acetabular component and the femoral stem are in place with the femoral head component not yet placed. Partially imaged prior left total hip arthroplasty. Please see the performing provider's procedural report for further detail. IMPRESSION: Intraoperative fluoroscopy during right total hip arthroplasty, as detailed above. Electronically Signed   By: Margaretha Sheffield MD   On: 05/03/2021 14:23    EKG: Orders placed or performed in visit on 02/10/21   EKG 12-Lead     Hospital Course: Doris [name] is a 75 y.o. who was admitted to [facility]. They were brought to the operating room on 05/03/2021 and underwent Procedure(s): [place].  Patient tolerated the procedure well and was later transferred to the recovery room and then to the orthopaedic floor for postoperative care. They were given PO and IV analgesics for pain control following their surgery. They were given 24 hours of postoperative  antibiotics of  Anti-infectives (From admission, onward)    Start     Dose/Rate Route Frequency Ordered Stop   05/03/21 2000  ceFAZolin (ANCEF) 2 g in sodium chloride 0.9 % 100 mL IVPB        2 g 200 mL/hr over 30 Minutes Intravenous Every 6 hours 05/03/21 1901 05/04/21 0235   05/03/21 1045  ceFAZolin (ANCEF) 2 g in sodium chloride 0.9 % 100 mL IVPB        2 g 200 mL/hr over 30 Minutes Intravenous On call 05/03/21 1033 05/03/21 1252      and started on DVT prophylaxis in the form of Aspirin.   PT and OT were ordered for total joint protocol. Discharge planning consulted to help with postop disposition and equipment needs.  Patient had a good night on the evening of surgery. They started to get up OOB with therapy on POD #1. Pt was seen during rounds and was ready to go home pending progress with therapy.She worked with therapy on POD #1 and was meeting her goals. Pt was discharged to home later that day in stable condition.  Diet: Regular diet Activity: WBAT Follow-up: in 2 weeks Disposition: Home Discharged Condition: good   Discharge Instructions     Call MD / Call 911   Complete by: As directed    If you experience chest pain or shortness of breath, CALL 911 and be transported to the hospital emergency room.  If you develope a fever above 101 F, pus (white drainage) or increased drainage or redness at the wound, or calf pain, call your surgeon's office.   Change dressing   Complete by: As directed    Maintain surgical dressing until follow up in the clinic. If the edges start to pull up, may reinforce with tape. If the dressing is no longer working, may remove and cover with gauze and tape, but must keep the area dry and clean.  Call with any questions or concerns.   Constipation Prevention   Complete by: As directed    Drink plenty of fluids.  Prune juice may be helpful.  You may use a stool softener, such as Colace (over the counter) 100 mg twice a day.  Use MiraLax (over the  counter) for constipation as needed.   Diet - low sodium heart healthy   Complete by: As directed    Increase activity slowly as tolerated   Complete by: As directed    Weight bearing as tolerated with assist device (walker, cane, etc) as directed, use it as long as suggested by your surgeon or therapist, typically at least 4-6 weeks.   Post-operative opioid taper instructions:   Complete by: As directed    POST-OPERATIVE OPIOID TAPER INSTRUCTIONS: It is important to wean off of your opioid medication as soon as possible. If you do not need pain medication after your surgery it is ok to stop day one. Opioids include: Codeine, Hydrocodone(Norco, Vicodin), Oxycodone(Percocet, oxycontin) and hydromorphone amongst others.  Long term and even short term use of opiods can cause: Increased pain response Dependence Constipation Depression Respiratory depression And more.  Withdrawal symptoms can include Flu like symptoms Nausea, vomiting And more Techniques to manage these symptoms Hydrate well Eat regular healthy meals Stay active Use relaxation techniques(deep breathing, meditating, yoga) Do Not substitute Alcohol to help with tapering If you have been on opioids for less than two weeks and do not have pain than it is ok to stop all together.  Plan to wean off of opioids This plan should start within one week post op of your joint replacement. Maintain the same interval or time between taking each  dose and first decrease the dose.  Cut the total daily intake of opioids by one tablet each day Next start to increase the time between doses. The last dose that should be eliminated is the evening dose.      TED hose   Complete by: As directed    Use stockings (TED hose) for 2 weeks on both leg(s).  You may remove them at night for sleeping.      Allergies as of 05/04/2021       Reactions   Vancomycin Anaphylaxis, Swelling   REACTION: severe reaction   Hydrocodone-acetaminophen  Nausea And Vomiting   Sulfa Antibiotics Nausea Only, Swelling   Clindamycin/lincomycin Swelling   Codeine Nausea Only, Other (See Comments)   upset stomach   Doxycycline Swelling   Penicillins Rash   Tolerated Cephalosporin Date: 05/03/21.   Rifampin Rash   Rocephin [ceftriaxone Sodium In Dextrose] Swelling, Rash        Medication List     STOP taking these medications    acetaminophen 500 MG tablet Commonly known as: TYLENOL   alendronate 70 MG tablet Commonly known as: FOSAMAX   aspirin EC 81 MG tablet Replaced by: aspirin 81 MG chewable tablet   ibuprofen 200 MG tablet Commonly known as: ADVIL       TAKE these medications    aspirin 81 MG chewable tablet Chew 1 tablet (81 mg total) by mouth 2 (two) times daily for 28 days. Then resume normal dose of aspirin 81 mg daily. Replaces: aspirin EC 81 MG tablet   cephALEXin 500 MG capsule Commonly known as: KEFLEX Take 2,000 mg by mouth See admin instructions. Take 4 capsules (2000 mg) by mouth 1 hour prior to dental procedures   docusate sodium 100 MG capsule Commonly known as: COLACE Take 1 capsule (100 mg total) by mouth 2 (two) times daily.   HYDROcodone-acetaminophen 5-325 MG tablet Commonly known as: NORCO/VICODIN Take 1-2 tablets by mouth every 6 (six) hours as needed for severe pain.   methocarbamol 500 MG tablet Commonly known as: ROBAXIN Take 1 tablet (500 mg total) by mouth every 6 (six) hours as needed for muscle spasms.   multivitamin with minerals Tabs tablet Take 1 tablet by mouth in the morning.   Pfizer-BioNTech COVID-19 Vacc 30 MCG/0.3ML injection Generic drug: COVID-19 mRNA vaccine (Pfizer) AS DIRECTED   Pfizer-BioNT COVID-19 Vac-TriS Susp injection Generic drug: COVID-19 mRNA Vac-TriS (Pfizer) Inject into the muscle.   polyethylene glycol 17 g packet Commonly known as: MIRALAX / GLYCOLAX Take 17 g by mouth daily as needed for mild constipation.   Vitamin D3 50 MCG (2000 UT)  Tabs Take 2,000 Units by mouth in the morning.       ASK your doctor about these medications    benazepril 20 MG tablet Commonly known as: LOTENSIN TAKE 1 TABLET BY MOUTH EVERY DAY   furosemide 20 MG tablet Commonly known as: LASIX TAKE 1 TABLET BY MOUTH EVERY DAY   Klor-Con M20 20 MEQ tablet Generic drug: potassium chloride SA TAKE 1 TABLET BY MOUTH EVERY DAY   simvastatin 40 MG tablet Commonly known as: ZOCOR TAKE 1 TABLET BY MOUTH EVERYDAY AT BEDTIME               Discharge Care Instructions  (From admission, onward)           Start     Ordered   05/04/21 0000  Change dressing       Comments: Maintain surgical dressing until follow  up in the clinic. If the edges start to pull up, may reinforce with tape. If the dressing is no longer working, may remove and cover with gauze and tape, but must keep the area dry and clean.  Call with any questions or concerns.   05/04/21 0857            Follow-up Information     Irving Copas, PA-C. Go on 05/18/2021.   Specialty: Orthopedic Surgery Why: You are scheduled for a follow up appointment on 05-18-21 at 10:45 am. Contact information: [address] [place] [address] [id number]                 Signed: Griffith Citron, PA-C Orthopedic Surgery 05/18/2021, 1:45 PM

## 2021-06-15 DIAGNOSIS — Z96641 Presence of right artificial hip joint: Secondary | ICD-10-CM | POA: Diagnosis not present

## 2021-06-15 DIAGNOSIS — Z471 Aftercare following joint replacement surgery: Secondary | ICD-10-CM | POA: Diagnosis not present

## 2021-08-08 ENCOUNTER — Ambulatory Visit: Payer: Medicare Other | Attending: Internal Medicine

## 2021-08-08 ENCOUNTER — Other Ambulatory Visit (HOSPITAL_BASED_OUTPATIENT_CLINIC_OR_DEPARTMENT_OTHER): Payer: Self-pay

## 2021-08-08 DIAGNOSIS — Z23 Encounter for immunization: Secondary | ICD-10-CM

## 2021-08-08 DIAGNOSIS — U071 COVID-19: Secondary | ICD-10-CM | POA: Diagnosis not present

## 2021-08-08 NOTE — Progress Notes (Signed)
   Covid-19 Vaccination Clinic  Name:  TAKEYAH WIEMAN    MRN: [id number] DOB: May 07, 1946  08/08/2021  Ms. Fason was observed post Covid-19 immunization for 15 minutes without incident. She was provided with Vaccine Information Sheet and instruction to access the V-Safe system.   Ms. Nienow was instructed to call 911 with any severe reactions post vaccine: Difficulty breathing  Swelling of face and throat  A fast heartbeat  A bad rash all over body  Dizziness and weakness

## 2021-08-12 DIAGNOSIS — E119 Type 2 diabetes mellitus without complications: Secondary | ICD-10-CM | POA: Diagnosis not present

## 2021-08-18 ENCOUNTER — Other Ambulatory Visit: Payer: Self-pay | Admitting: Obstetrics and Gynecology

## 2021-08-18 DIAGNOSIS — E1165 Type 2 diabetes mellitus with hyperglycemia: Secondary | ICD-10-CM | POA: Diagnosis not present

## 2021-08-18 DIAGNOSIS — Z1231 Encounter for screening mammogram for malignant neoplasm of breast: Secondary | ICD-10-CM

## 2021-08-18 DIAGNOSIS — E785 Hyperlipidemia, unspecified: Secondary | ICD-10-CM | POA: Diagnosis not present

## 2021-08-18 DIAGNOSIS — E210 Primary hyperparathyroidism: Secondary | ICD-10-CM | POA: Diagnosis not present

## 2021-08-23 DIAGNOSIS — E119 Type 2 diabetes mellitus without complications: Secondary | ICD-10-CM | POA: Diagnosis not present

## 2021-08-23 DIAGNOSIS — I10 Essential (primary) hypertension: Secondary | ICD-10-CM | POA: Diagnosis not present

## 2021-08-23 DIAGNOSIS — M8580 Other specified disorders of bone density and structure, unspecified site: Secondary | ICD-10-CM | POA: Diagnosis not present

## 2021-08-23 DIAGNOSIS — E210 Primary hyperparathyroidism: Secondary | ICD-10-CM | POA: Diagnosis not present

## 2021-09-05 ENCOUNTER — Other Ambulatory Visit (HOSPITAL_BASED_OUTPATIENT_CLINIC_OR_DEPARTMENT_OTHER): Payer: Self-pay

## 2021-09-05 DIAGNOSIS — Z23 Encounter for immunization: Secondary | ICD-10-CM | POA: Diagnosis not present

## 2021-09-05 MED ORDER — PFIZER COVID-19 VAC BIVALENT 30 MCG/0.3ML IM SUSP
INTRAMUSCULAR | 0 refills | Status: DC
  Filled 2021-09-05: qty 0.3, 1d supply, fill #0

## 2021-09-20 DIAGNOSIS — Z01419 Encounter for gynecological examination (general) (routine) without abnormal findings: Secondary | ICD-10-CM | POA: Diagnosis not present

## 2021-10-18 ENCOUNTER — Ambulatory Visit
Admission: RE | Admit: 2021-10-18 | Discharge: 2021-10-18 | Disposition: A | Discharge status: Home / Self Care | Payer: Medicare Other | Source: Ambulatory Visit | Attending: Obstetrics and Gynecology | Admitting: Obstetrics and Gynecology

## 2021-10-18 DIAGNOSIS — Z1231 Encounter for screening mammogram for malignant neoplasm of breast: Secondary | ICD-10-CM | POA: Diagnosis not present

## 2021-12-02 DIAGNOSIS — U071 COVID-19: Secondary | ICD-10-CM | POA: Diagnosis not present

## 2022-01-17 DIAGNOSIS — U071 COVID-19: Secondary | ICD-10-CM | POA: Diagnosis not present

## 2022-02-10 ENCOUNTER — Other Ambulatory Visit: Payer: Self-pay | Admitting: *Deleted

## 2022-02-10 DIAGNOSIS — E785 Hyperlipidemia, unspecified: Secondary | ICD-10-CM | POA: Diagnosis not present

## 2022-02-10 DIAGNOSIS — I10 Essential (primary) hypertension: Secondary | ICD-10-CM

## 2022-02-11 LAB — BASIC METABOLIC PANEL
BUN/Creatinine Ratio: 38 — ABNORMAL HIGH (ref 12–28)
BUN: 20 mg/dL (ref 8–27)
CO2: 19 mmol/L — ABNORMAL LOW (ref 20–29)
Calcium: 11.9 mg/dL — ABNORMAL HIGH (ref 8.7–10.3)
Chloride: 103 mmol/L (ref 96–106)
Creatinine, Ser: 0.53 mg/dL — ABNORMAL LOW (ref 0.57–1.00)
Glucose: 87 mg/dL (ref 70–99)
Potassium: 4.7 mmol/L (ref 3.5–5.2)
Sodium: 143 mmol/L (ref 134–144)
eGFR: 97 mL/min/{1.73_m2} (ref >59)

## 2022-02-11 LAB — CBC
Hematocrit: 41.5 % (ref 34.0–46.6)
Hemoglobin: 13.7 g/dL (ref 11.1–15.9)
MCH: 28.6 pg (ref 26.6–33.0)
MCHC: 33 g/dL (ref 31.5–35.7)
MCV: 87 fL (ref 79–97)
Platelets: 154 10*3/uL (ref 150–450)
RBC: 4.79 x10E6/uL (ref 3.77–5.28)
RDW: 13.7 % (ref 11.7–15.4)
WBC: 10.7 10*3/uL (ref 3.4–10.8)

## 2022-02-11 LAB — LIPID PANEL
Chol/HDL Ratio: 2.6 ratio (ref 0.0–4.4)
Cholesterol, Total: 146 mg/dL (ref 100–199)
HDL: 57 mg/dL (ref >39)
LDL Chol Calc (NIH): 76 mg/dL (ref 0–99)
Triglycerides: 65 mg/dL (ref 0–149)
VLDL Cholesterol Cal: 13 mg/dL (ref 5–40)

## 2022-02-14 ENCOUNTER — Ambulatory Visit (INDEPENDENT_AMBULATORY_CARE_PROVIDER_SITE_OTHER): Payer: Medicare Other | Admitting: Cardiovascular Disease

## 2022-02-14 ENCOUNTER — Encounter: Payer: Self-pay | Admitting: Cardiovascular Disease

## 2022-02-14 VITALS — BP 142/78 | HR 88 | Ht 61.5 in | Wt 138.6 lb

## 2022-02-14 DIAGNOSIS — I10 Essential (primary) hypertension: Secondary | ICD-10-CM

## 2022-02-14 DIAGNOSIS — M79605 Pain in left leg: Secondary | ICD-10-CM | POA: Diagnosis not present

## 2022-02-14 DIAGNOSIS — E7800 Pure hypercholesterolemia, unspecified: Secondary | ICD-10-CM | POA: Diagnosis not present

## 2022-02-14 DIAGNOSIS — E210 Primary hyperparathyroidism: Secondary | ICD-10-CM

## 2022-02-14 NOTE — Progress Notes (Signed)
Of the  ? ?Cardiology Office Note   ? ?Date:  02/14/2022  ? ?ID:  Doris [name], DOB 1946/06/09, MRN [id number] ? ?PCP:  Doris Shanks, MD  ?Cardiologist:   Doris Klein, MD  ? ?Chief Complaint  ?Patient presents with  ? Hyperlipidemia  ? ? ?History of Present Illness:  ?Doris [name] is a 76 y.o. female with hypertension, hyperlipidemia previous history of severe obesity (now minimally overweight only), hyperparathyroidism returning for routine follow-up.  ? ?Gay Filler continues to pay excellent attention to her heart health.  She is exercising at least 5 days a week at the [facility], walking 10 laps each time.  She has kept the weight off and has actually lost a few more pounds since last year and her BMI is now just slightly above ideal at 25.70.  Her lipid profile shows excellent improvement, in particular a remarkable increase in the HDL cholesterol.  Her most recent hemoglobin A1c from last October was 6.0%.  She remains moderately hypercalcemic (she was off alendronate for a few months while her hip healed from surgery).  She restarted bisphosphonates in January and has an appointment with her endocrinologist, Dr. Buddy [name] in the next few weeks.  Most recent labs show that her calcium was 12.1.  She has normal creatinine.  She has not had kidney stones or bone fractures or falls. ? ?When she checks her blood pressure at the [facility] runs 137-140/70.  Her blood pressure was a little high at 160/92 when she first checked in but when I rechecked it it was 143/76 in both the right and left upper extremity. ? ?Her right hip has healed well and does not bother her anymore, she is now complaining of pain in her left lumbar area radiating down the lateral surface of her left thigh (sounds like she is describing meralgia paresthetica).  It is worse after she exercises, not while exercising. Her orthopedic surgeon is Dr. Alvan Dame. ? ?The patient specifically denies any chest pain at rest exertion, dyspnea at rest or with exertion,  orthopnea, paroxysmal nocturnal dyspnea, syncope, palpitations, focal neurological deficits, intermittent claudication, lower extremity edema, unexplained weight gain, cough, hemoptysis or wheezing. ? ? ? ?Past Medical History:  ?Diagnosis Date  ? Abnormal uterine bleeding   ? Condyloma   ? Degenerative disc disease   ? C-spine, back, hip surgery  ? Depression   ? Dysplasia of cervix, low grade (CIN 1)   ? Hypercalcemia   ? Hyperlipidemia   ? Hypertension   ? Migraine   ? MRSA (methicillin resistant Staphylococcus aureus) 10/24/2007  ? after back surg- #3 I&D surgewries after  ? Parathyroid disease ([place])   ? hyperparathyroid  ? Pneumonia   ? Pre-diabetes   ? ? ?Past Surgical History:  ?Procedure Laterality Date  ? CARDIOVASCULAR STRESS TEST  05/24/2011  ? No scintigraphic evidence of inducible myocardial ischemia. Alithough there are no reversible perfusion defects, the presence of a TID ratio >1.2 is concerning for multivessel disease.  ? CERVICAL SPINE SURGERY    ? CESAREAN SECTION    ? GYNECOLOGIC CRYOSURGERY    ? SPINE SURGERY  10/24/2007  ? x4  ? TOTAL HIP ARTHROPLASTY Left   ? TOTAL HIP ARTHROPLASTY Right 05/03/2021  ? Procedure: TOTAL HIP ARTHROPLASTY ANTERIOR APPROACH;  Surgeon: Doris Cancel, MD;  Location: [facility];  Service: Orthopedics;  Laterality: Right;  ? UPPER EXTREMITY VENOUS DOPPLER  11/14/2008  ? No evidence of DVT or superficial thrombosis of the right upper extremity  ? ? ?  Current Medications: ?Outpatient Medications Prior to Visit  ?Medication Sig Dispense Refill  ? alendronate (FOSAMAX) 70 MG tablet Take 70 mg by mouth once a week.    ? aspirin 81 MG chewable tablet Chew 81 mg by mouth daily in the afternoon.    ? benazepril (LOTENSIN) 20 MG tablet TAKE 1 TABLET BY MOUTH EVERY DAY (Patient taking differently: Take 20 mg by mouth in the morning.) 90 tablet 3  ? Cholecalciferol (VITAMIN D3) 50 MCG (2000 UT) TABS Take 2,000 Units by mouth in the morning.    ? furosemide (LASIX) 20 MG tablet TAKE 1  TABLET BY MOUTH EVERY DAY (Patient taking differently: Take 20 mg by mouth in the morning.) 90 tablet 3  ? KLOR-CON M20 20 MEQ tablet TAKE 1 TABLET BY MOUTH EVERY DAY (Patient taking differently: Take 20 mEq by mouth in the morning.) 90 tablet 3  ? Multiple Vitamin (MULTIVITAMIN WITH MINERALS) TABS tablet Take 1 tablet by mouth in the morning.    ? simvastatin (ZOCOR) 40 MG tablet TAKE 1 TABLET BY MOUTH EVERYDAY AT BEDTIME (Patient taking differently: Take 40 mg by mouth at bedtime.) 90 tablet 3  ? cephALEXin (KEFLEX) 500 MG capsule Take 2,000 mg by mouth See admin instructions. Take 4 capsules (2000 mg) by mouth 1 hour prior to dental procedures (Patient not taking: Reported on 02/14/2022)    ? COVID-19 mRNA bivalent vaccine, Pfizer, (PFIZER COVID-19 VAC BIVALENT) injection Inject into the muscle. (Patient not taking: Reported on 02/14/2022) 0.3 mL 0  ? COVID-19 mRNA Vac-TriS, Pfizer, (PFIZER-BIONT COVID-19 VAC-TRIS) SUSP injection Inject into the muscle. (Patient not taking: Reported on 02/14/2022) 0.3 mL 0  ? docusate sodium (COLACE) 100 MG capsule Take 1 capsule (100 mg total) by mouth 2 (two) times daily. (Patient not taking: Reported on 02/14/2022) 10 capsule 0  ? HYDROcodone-acetaminophen (NORCO/VICODIN) 5-325 MG tablet Take 1-2 tablets by mouth every 6 (six) hours as needed for severe pain. (Patient not taking: Reported on 02/14/2022) 42 tablet 0  ? methocarbamol (ROBAXIN) 500 MG tablet Take 1 tablet (500 mg total) by mouth every 6 (six) hours as needed for muscle spasms. (Patient not taking: Reported on 02/14/2022) 40 tablet 0  ? polyethylene glycol (MIRALAX / GLYCOLAX) 17 g packet Take 17 g by mouth daily as needed for mild constipation. (Patient not taking: Reported on 02/14/2022) 14 each 0  ? ?No facility-administered medications prior to visit.  ?  ? ?Allergies:   Vancomycin, Hydrocodone-acetaminophen, Sulfa antibiotics, Clindamycin/lincomycin, Codeine, Doxycycline, Penicillins, Rifampin, and Rocephin  [ceftriaxone sodium in dextrose]  ? ?Social History  ? ?Socioeconomic History  ? Marital status: Divorced  ?  Spouse name: Not on file  ? Number of children: Not on file  ? Years of education: Not on file  ? Highest education level: Not on file  ?Occupational History  ? Not on file  ?Tobacco Use  ? Smoking status: Never  ? Smokeless tobacco: Never  ?Vaping Use  ? Vaping Use: Never used  ?Substance and Sexual Activity  ? Alcohol use: No  ? Drug use: No  ? Sexual activity: Not Currently  ?Other Topics Concern  ? Not on file  ?Social History Narrative  ? Not on file  ? ?Social Determinants of Health  ? ?Financial Resource Strain: Not on file  ?Food Insecurity: Not on file  ?Transportation Needs: Not on file  ?Physical Activity: Not on file  ?Stress: Not on file  ?Social Connections: Not on file  ?  ? ?Family History:  The patient's family history includes COPD in her sister; Heart disease in her father and mother; Stroke in her father and mother.  ? ?ROS:   ?Please see the history of present illness.    ?ROS All other systems reviewed and are negative. ? ? ?PHYSICAL EXAM:   ?VS:  BP (!) 142/78 (BP Location: Left Arm, Patient Position: Sitting)   Pulse 88   Ht 5' 1.5" (1.562 m)   Wt 138 lb 9.6 oz (62.9 kg)   SpO2 96%   BMI 25.76 kg/m?    ? ? ? ?General: Alert, oriented x3, no distress, appears healthy and fit ?Head: no evidence of trauma, PERRL, EOMI, no exophtalmos or lid lag, no myxedema, no xanthelasma; normal ears, nose and oropharynx ?Neck: normal jugular venous pulsations and no hepatojugular reflux; brisk carotid pulses without delay and no carotid bruits ?Chest: clear to auscultation, no signs of consolidation by percussion or palpation, normal fremitus, symmetrical and full respiratory excursions ?Cardiovascular: normal position and quality of the apical impulse, regular rhythm, normal first and second heart sounds, no murmurs, rubs or gallops ?Abdomen: no tenderness or distention, no masses by palpation,  no abnormal pulsatility or arterial bruits, normal bowel sounds, no hepatosplenomegaly ?Extremities: no clubbing, cyanosis or edema; 2+ radial, ulnar and brachial pulses bilaterally; 2+ right femoral, posterior tibi

## 2022-02-14 NOTE — Patient Instructions (Signed)

## 2022-02-20 DIAGNOSIS — E210 Primary hyperparathyroidism: Secondary | ICD-10-CM | POA: Diagnosis not present

## 2022-02-20 DIAGNOSIS — E119 Type 2 diabetes mellitus without complications: Secondary | ICD-10-CM | POA: Diagnosis not present

## 2022-02-26 ENCOUNTER — Other Ambulatory Visit: Payer: Self-pay | Admitting: Cardiovascular Disease

## 2022-02-27 ENCOUNTER — Other Ambulatory Visit: Payer: Self-pay | Admitting: Internal Medicine

## 2022-02-27 DIAGNOSIS — E210 Primary hyperparathyroidism: Secondary | ICD-10-CM | POA: Diagnosis not present

## 2022-02-27 DIAGNOSIS — E119 Type 2 diabetes mellitus without complications: Secondary | ICD-10-CM | POA: Diagnosis not present

## 2022-02-27 DIAGNOSIS — M8580 Other specified disorders of bone density and structure, unspecified site: Secondary | ICD-10-CM | POA: Diagnosis not present

## 2022-02-27 DIAGNOSIS — I10 Essential (primary) hypertension: Secondary | ICD-10-CM | POA: Diagnosis not present

## 2022-02-27 DIAGNOSIS — M545 Low back pain, unspecified: Secondary | ICD-10-CM | POA: Diagnosis not present

## 2022-03-03 ENCOUNTER — Ambulatory Visit
Admission: RE | Admit: 2022-03-03 | Discharge: 2022-03-03 | Disposition: A | Discharge status: Home / Self Care | Payer: Medicare Other | Source: Ambulatory Visit | Attending: Internal Medicine | Admitting: Internal Medicine

## 2022-03-03 DIAGNOSIS — E210 Primary hyperparathyroidism: Secondary | ICD-10-CM | POA: Diagnosis not present

## 2022-03-03 DIAGNOSIS — Z780 Asymptomatic menopausal state: Secondary | ICD-10-CM | POA: Diagnosis not present

## 2022-03-03 DIAGNOSIS — M8580 Other specified disorders of bone density and structure, unspecified site: Secondary | ICD-10-CM

## 2022-03-03 DIAGNOSIS — M810 Age-related osteoporosis without current pathological fracture: Secondary | ICD-10-CM | POA: Diagnosis not present

## 2022-03-08 ENCOUNTER — Other Ambulatory Visit: Payer: Self-pay | Admitting: Cardiovascular Disease

## 2022-04-12 ENCOUNTER — Other Ambulatory Visit: Payer: Self-pay | Admitting: Cardiovascular Disease

## 2022-04-19 DIAGNOSIS — E785 Hyperlipidemia, unspecified: Secondary | ICD-10-CM | POA: Diagnosis not present

## 2022-04-19 DIAGNOSIS — M818 Other osteoporosis without current pathological fracture: Secondary | ICD-10-CM | POA: Diagnosis not present

## 2022-04-19 DIAGNOSIS — I10 Essential (primary) hypertension: Secondary | ICD-10-CM | POA: Diagnosis not present

## 2022-04-19 DIAGNOSIS — E210 Primary hyperparathyroidism: Secondary | ICD-10-CM | POA: Diagnosis not present

## 2022-04-19 DIAGNOSIS — Z1331 Encounter for screening for depression: Secondary | ICD-10-CM | POA: Diagnosis not present

## 2022-04-19 DIAGNOSIS — Z0000 Encounter for general adult medical examination without abnormal findings: Secondary | ICD-10-CM | POA: Diagnosis not present

## 2022-04-19 DIAGNOSIS — E1165 Type 2 diabetes mellitus with hyperglycemia: Secondary | ICD-10-CM | POA: Diagnosis not present

## 2022-05-31 DIAGNOSIS — I10 Essential (primary) hypertension: Secondary | ICD-10-CM | POA: Diagnosis not present

## 2022-05-31 DIAGNOSIS — M8580 Other specified disorders of bone density and structure, unspecified site: Secondary | ICD-10-CM | POA: Diagnosis not present

## 2022-05-31 DIAGNOSIS — E119 Type 2 diabetes mellitus without complications: Secondary | ICD-10-CM | POA: Diagnosis not present

## 2022-05-31 DIAGNOSIS — E210 Primary hyperparathyroidism: Secondary | ICD-10-CM | POA: Diagnosis not present

## 2022-07-04 ENCOUNTER — Telehealth: Payer: Self-pay | Admitting: Cardiovascular Disease

## 2022-07-04 NOTE — Telephone Encounter (Signed)
*  STAT* If patient is at the pharmacy, call can be transferred to refill team.   1. Which medications need to be refilled? (please list name of each medication and dose if known) Furosemide  2. Which pharmacy/location (including street and city if local pharmacy) is medication to be sent to? [facility] [place] [place]  3. Do they need a 30 day or 90 day supply?  90 days and refills

## 2022-07-06 MED ORDER — FUROSEMIDE 20 MG PO TABS: 20.0000 mg | ORAL_TABLET | Freq: Every day | ORAL | 2 refills | Status: DC

## 2022-07-06 NOTE — Telephone Encounter (Signed)
Patient is following up regarding refill request.  *STAT* If patient is at the pharmacy, call can be transferred to refill team.   1. Which medications need to be refilled? (please list name of each medication and dose if known)  furosemide (LASIX) 20 MG tablet  2. Which pharmacy/location (including street and city if local pharmacy) is medication to be sent to? [id number] [place] [place] [facility] [address] 3. Do they need a 30 day or 90 day supply?  90 day supply

## 2022-07-31 DIAGNOSIS — Z96641 Presence of right artificial hip joint: Secondary | ICD-10-CM | POA: Diagnosis not present

## 2022-07-31 DIAGNOSIS — M48062 Spinal stenosis, lumbar region with neurogenic claudication: Secondary | ICD-10-CM | POA: Diagnosis not present

## 2022-07-31 DIAGNOSIS — M25552 Pain in left hip: Secondary | ICD-10-CM | POA: Diagnosis not present

## 2022-07-31 DIAGNOSIS — Z96642 Presence of left artificial hip joint: Secondary | ICD-10-CM | POA: Diagnosis not present

## 2022-08-21 ENCOUNTER — Other Ambulatory Visit: Payer: Medicare Other

## 2022-08-24 DIAGNOSIS — Z23 Encounter for immunization: Secondary | ICD-10-CM | POA: Diagnosis not present

## 2022-08-25 DIAGNOSIS — M8580 Other specified disorders of bone density and structure, unspecified site: Secondary | ICD-10-CM | POA: Diagnosis not present

## 2022-08-25 DIAGNOSIS — E210 Primary hyperparathyroidism: Secondary | ICD-10-CM | POA: Diagnosis not present

## 2022-08-25 DIAGNOSIS — E119 Type 2 diabetes mellitus without complications: Secondary | ICD-10-CM | POA: Diagnosis not present

## 2022-08-25 DIAGNOSIS — I10 Essential (primary) hypertension: Secondary | ICD-10-CM | POA: Diagnosis not present

## 2022-08-29 DIAGNOSIS — R0981 Nasal congestion: Secondary | ICD-10-CM | POA: Diagnosis not present

## 2022-08-29 DIAGNOSIS — U071 COVID-19: Secondary | ICD-10-CM | POA: Diagnosis not present

## 2022-09-13 ENCOUNTER — Other Ambulatory Visit: Payer: Self-pay | Admitting: Obstetrics and Gynecology

## 2022-09-13 ENCOUNTER — Encounter: Payer: Self-pay | Admitting: Obstetrics and Gynecology

## 2022-09-13 DIAGNOSIS — Z1231 Encounter for screening mammogram for malignant neoplasm of breast: Secondary | ICD-10-CM

## 2022-09-25 DIAGNOSIS — E119 Type 2 diabetes mellitus without complications: Secondary | ICD-10-CM | POA: Diagnosis not present

## 2022-09-25 DIAGNOSIS — E210 Primary hyperparathyroidism: Secondary | ICD-10-CM | POA: Diagnosis not present

## 2022-09-25 DIAGNOSIS — M8580 Other specified disorders of bone density and structure, unspecified site: Secondary | ICD-10-CM | POA: Diagnosis not present

## 2022-09-25 DIAGNOSIS — I10 Essential (primary) hypertension: Secondary | ICD-10-CM | POA: Diagnosis not present

## 2022-10-26 ENCOUNTER — Other Ambulatory Visit (HOSPITAL_COMMUNITY): Payer: Self-pay

## 2022-10-30 DIAGNOSIS — M818 Other osteoporosis without current pathological fracture: Secondary | ICD-10-CM | POA: Diagnosis not present

## 2022-10-30 DIAGNOSIS — E210 Primary hyperparathyroidism: Secondary | ICD-10-CM | POA: Diagnosis not present

## 2022-10-30 DIAGNOSIS — I10 Essential (primary) hypertension: Secondary | ICD-10-CM | POA: Diagnosis not present

## 2022-10-30 DIAGNOSIS — E1165 Type 2 diabetes mellitus with hyperglycemia: Secondary | ICD-10-CM | POA: Diagnosis not present

## 2022-10-30 DIAGNOSIS — E785 Hyperlipidemia, unspecified: Secondary | ICD-10-CM | POA: Diagnosis not present

## 2022-11-02 DIAGNOSIS — Z23 Encounter for immunization: Secondary | ICD-10-CM | POA: Diagnosis not present

## 2022-11-17 ENCOUNTER — Ambulatory Visit: Payer: Medicare Other

## 2022-11-20 ENCOUNTER — Ambulatory Visit
Admission: RE | Admit: 2022-11-20 | Discharge: 2022-11-20 | Disposition: A | Discharge status: Home / Self Care | Payer: Medicare Other | Source: Ambulatory Visit | Attending: Obstetrics and Gynecology | Admitting: Obstetrics and Gynecology

## 2022-11-20 DIAGNOSIS — Z1231 Encounter for screening mammogram for malignant neoplasm of breast: Secondary | ICD-10-CM

## 2023-03-01 ENCOUNTER — Other Ambulatory Visit: Payer: Self-pay | Admitting: Cardiovascular Disease

## 2023-03-26 DIAGNOSIS — E119 Type 2 diabetes mellitus without complications: Secondary | ICD-10-CM | POA: Diagnosis not present

## 2023-03-26 DIAGNOSIS — E210 Primary hyperparathyroidism: Secondary | ICD-10-CM | POA: Diagnosis not present

## 2023-03-26 DIAGNOSIS — M8580 Other specified disorders of bone density and structure, unspecified site: Secondary | ICD-10-CM | POA: Diagnosis not present

## 2023-03-28 ENCOUNTER — Other Ambulatory Visit: Payer: Self-pay | Admitting: Cardiovascular Disease

## 2023-03-29 DIAGNOSIS — I10 Essential (primary) hypertension: Secondary | ICD-10-CM | POA: Diagnosis not present

## 2023-03-29 DIAGNOSIS — D692 Other nonthrombocytopenic purpura: Secondary | ICD-10-CM | POA: Diagnosis not present

## 2023-03-29 DIAGNOSIS — M8580 Other specified disorders of bone density and structure, unspecified site: Secondary | ICD-10-CM | POA: Diagnosis not present

## 2023-03-29 DIAGNOSIS — E119 Type 2 diabetes mellitus without complications: Secondary | ICD-10-CM | POA: Diagnosis not present

## 2023-03-29 DIAGNOSIS — E210 Primary hyperparathyroidism: Secondary | ICD-10-CM | POA: Diagnosis not present

## 2023-04-28 ENCOUNTER — Other Ambulatory Visit: Payer: Self-pay | Admitting: Cardiovascular Disease

## 2023-06-27 ENCOUNTER — Other Ambulatory Visit: Payer: Self-pay | Admitting: Cardiovascular Disease

## 2023-07-03 DIAGNOSIS — E210 Primary hyperparathyroidism: Secondary | ICD-10-CM | POA: Diagnosis not present

## 2023-07-03 DIAGNOSIS — M170 Bilateral primary osteoarthritis of knee: Secondary | ICD-10-CM | POA: Diagnosis not present

## 2023-07-03 DIAGNOSIS — Z0000 Encounter for general adult medical examination without abnormal findings: Secondary | ICD-10-CM | POA: Diagnosis not present

## 2023-07-03 DIAGNOSIS — M5442 Lumbago with sciatica, left side: Secondary | ICD-10-CM | POA: Diagnosis not present

## 2023-07-03 DIAGNOSIS — G8929 Other chronic pain: Secondary | ICD-10-CM | POA: Diagnosis not present

## 2023-07-03 DIAGNOSIS — H6121 Impacted cerumen, right ear: Secondary | ICD-10-CM | POA: Diagnosis not present

## 2023-07-03 DIAGNOSIS — E785 Hyperlipidemia, unspecified: Secondary | ICD-10-CM | POA: Diagnosis not present

## 2023-07-03 DIAGNOSIS — M818 Other osteoporosis without current pathological fracture: Secondary | ICD-10-CM | POA: Diagnosis not present

## 2023-07-03 DIAGNOSIS — I10 Essential (primary) hypertension: Secondary | ICD-10-CM | POA: Diagnosis not present

## 2023-07-03 DIAGNOSIS — E1165 Type 2 diabetes mellitus with hyperglycemia: Secondary | ICD-10-CM | POA: Diagnosis not present

## 2023-07-05 ENCOUNTER — Telehealth: Payer: Self-pay | Admitting: Cardiovascular Disease

## 2023-07-05 DIAGNOSIS — I10 Essential (primary) hypertension: Secondary | ICD-10-CM

## 2023-07-05 DIAGNOSIS — E785 Hyperlipidemia, unspecified: Secondary | ICD-10-CM

## 2023-07-05 NOTE — Telephone Encounter (Signed)
CMET and lipid panel please

## 2023-07-05 NOTE — Telephone Encounter (Signed)
Pt states she would like the orders for her labs to be put in. Please advise

## 2023-07-05 NOTE — Telephone Encounter (Signed)
Spoke with patient and she would like labs before her appointment with Dr. Royann Shivers on 10/30. What labs would like you to order for patient

## 2023-07-06 DIAGNOSIS — Z23 Encounter for immunization: Secondary | ICD-10-CM | POA: Diagnosis not present

## 2023-07-09 NOTE — Telephone Encounter (Signed)
Left message stating that labs have been ordered= lipid panel and CMP. She will need to be fasting, nothing to eat or drink after midnight- water is ok. She can come in between 8-4; no appt is needed. Call with any questions- [phone]

## 2023-07-10 DIAGNOSIS — Z23 Encounter for immunization: Secondary | ICD-10-CM | POA: Diagnosis not present

## 2023-08-08 ENCOUNTER — Other Ambulatory Visit: Payer: Self-pay | Admitting: Obstetrics and Gynecology

## 2023-08-08 DIAGNOSIS — Z1231 Encounter for screening mammogram for malignant neoplasm of breast: Secondary | ICD-10-CM

## 2023-08-20 ENCOUNTER — Other Ambulatory Visit: Payer: Self-pay

## 2023-08-20 DIAGNOSIS — I10 Essential (primary) hypertension: Secondary | ICD-10-CM

## 2023-08-20 DIAGNOSIS — E785 Hyperlipidemia, unspecified: Secondary | ICD-10-CM

## 2023-08-21 LAB — COMPREHENSIVE METABOLIC PANEL
ALT: 11 [IU]/L (ref 0–32)
AST: 16 [IU]/L (ref 0–40)
Albumin: 4.3 g/dL (ref 3.8–4.8)
Alkaline Phosphatase: 84 [IU]/L (ref 44–121)
BUN/Creatinine Ratio: 39 — ABNORMAL HIGH (ref 12–28)
BUN: 21 mg/dL (ref 8–27)
Bilirubin Total: 0.4 mg/dL (ref 0.0–1.2)
CO2: 26 mmol/L (ref 20–29)
Calcium: 12 mg/dL — ABNORMAL HIGH (ref 8.7–10.3)
Chloride: 103 mmol/L (ref 96–106)
Creatinine, Ser: 0.54 mg/dL — ABNORMAL LOW (ref 0.57–1.00)
Globulin, Total: 3.1 g/dL (ref 1.5–4.5)
Glucose: 99 mg/dL (ref 70–99)
Potassium: 4.7 mmol/L (ref 3.5–5.2)
Sodium: 140 mmol/L (ref 134–144)
Total Protein: 7.4 g/dL (ref 6.0–8.5)
eGFR: 95 mL/min/{1.73_m2} (ref >59)

## 2023-08-21 LAB — LIPID PANEL
Chol/HDL Ratio: 3.1 ratio (ref 0.0–4.4)
Cholesterol, Total: 140 mg/dL (ref 100–199)
HDL: 45 mg/dL (ref >39)
LDL Chol Calc (NIH): 80 mg/dL (ref 0–99)
Triglycerides: 73 mg/dL (ref 0–149)
VLDL Cholesterol Cal: 15 mg/dL (ref 5–40)

## 2023-08-22 ENCOUNTER — Ambulatory Visit: Payer: Medicare Other | Attending: Cardiovascular Disease | Admitting: Cardiovascular Disease

## 2023-08-22 ENCOUNTER — Encounter: Payer: Self-pay | Admitting: Cardiovascular Disease

## 2023-08-22 ENCOUNTER — Telehealth: Payer: Self-pay | Admitting: Cardiovascular Disease

## 2023-08-22 VITALS — BP 147/73 | HR 84 | Wt 137.6 lb

## 2023-08-22 DIAGNOSIS — R7303 Prediabetes: Secondary | ICD-10-CM | POA: Insufficient documentation

## 2023-08-22 DIAGNOSIS — I10 Essential (primary) hypertension: Secondary | ICD-10-CM | POA: Insufficient documentation

## 2023-08-22 DIAGNOSIS — E785 Hyperlipidemia, unspecified: Secondary | ICD-10-CM | POA: Insufficient documentation

## 2023-08-22 DIAGNOSIS — M25511 Pain in right shoulder: Secondary | ICD-10-CM | POA: Insufficient documentation

## 2023-08-22 DIAGNOSIS — E210 Primary hyperparathyroidism: Secondary | ICD-10-CM | POA: Insufficient documentation

## 2023-08-22 NOTE — Telephone Encounter (Signed)
Patient wants to get copy of all lab results done on Monday, 10/28 and updated After Visit Summary from today mailed to her.  Patient wants a call back to confirm when mailed.

## 2023-08-22 NOTE — Telephone Encounter (Signed)
Printed out labs and AVS printed out but could not mail. Patient needs to sign a records release form.

## 2023-08-22 NOTE — Patient Instructions (Signed)

## 2023-08-22 NOTE — Progress Notes (Signed)
Cardiology Office Note    Date:  08/22/2023   ID:  Doris [name], DOB 01-23-1946, MRN [id number]  PCP:  Delma Officer, PA  Cardiologist:   Thurmon Fair, MD   Chief Complaint  Patient presents with   Hyperlipidemia    History of Present Illness:  Doris [name] is a 77 y.o. female with hypertension, hyperlipidemia previous history of severe obesity (now minimally overweight only), hyperparathyroidism returning for routine follow-up.   She is having some problems with pain in her right shoulder that is related to certain movements and is improved with massage.  Her blood pressure has been a little high today and she thinks it is because of pain.  She is still exercising a minimum of 5 days a week and tries to walk 15 laps at the [facility] each time she goes.  She remains only borderline overweight with a BMI just over 25.  The patient specifically denies any chest pain at rest exertion, dyspnea at rest or with exertion, orthopnea, paroxysmal nocturnal dyspnea, syncope, palpitations, focal neurological deficits, intermittent claudication, lower extremity edema, unexplained weight gain, cough, hemoptysis or wheezing.   Her most recent lipid profile was good with LDL of 63 in June, 80 in October, but she continues to have the rather low HDL of 38-45; hemoglobin A1c likewise elevated at 6.1%, consistent with insulin resistance/metabolic syndrome.  She takes furosemide to help with hypercalcemia due to hyperparathyroidism.  She has normal renal function parameters.  She has not had any problems with nephrolithiasis, bone fractures or other consequences of hypercalcemia today.  Just last month her blood pressure was normal at 131/72.  Her blood pressure is a little high today, but she is in pain due to her shoulder.   Past Medical History:  Diagnosis Date   Abnormal uterine bleeding    Condyloma    Degenerative disc disease    C-spine, back, hip surgery   Depression     Dysplasia of cervix, low grade (CIN 1)    Hypercalcemia    Hyperlipidemia    Hypertension    Migraine    MRSA (methicillin resistant Staphylococcus aureus) 10/24/2007   after back surg- #3 I&D surgewries after   Parathyroid disease (HCC)    hyperparathyroid   Pneumonia    Pre-diabetes     Past Surgical History:  Procedure Laterality Date   CARDIOVASCULAR STRESS TEST  05/24/2011   No scintigraphic evidence of inducible myocardial ischemia. Alithough there are no reversible perfusion defects, the presence of a TID ratio >1.2 is concerning for multivessel disease.   CERVICAL SPINE SURGERY     CESAREAN SECTION     GYNECOLOGIC CRYOSURGERY     SPINE SURGERY  10/24/2007   x4   TOTAL HIP ARTHROPLASTY Left    TOTAL HIP ARTHROPLASTY Right 05/03/2021   Procedure: TOTAL HIP ARTHROPLASTY ANTERIOR APPROACH;  Surgeon: Durene Romans, MD;  Location: [facility];  Service: Orthopedics;  Laterality: Right;   UPPER EXTREMITY VENOUS DOPPLER  11/14/2008   No evidence of DVT or superficial thrombosis of the right upper extremity    Current Medications: Outpatient Medications Prior to Visit  Medication Sig Dispense Refill   alendronate (FOSAMAX) 70 MG tablet Take 70 mg by mouth once a week.     aspirin 81 MG chewable tablet Chew 81 mg by mouth daily in the afternoon.     benazepril (LOTENSIN) 20 MG tablet TAKE 1 TABLET BY MOUTH EVERY DAY 90 tablet 1   furosemide (  LASIX) 20 MG tablet TAKE 1 TABLET BY MOUTH EVERY DAY 90 tablet 2   KLOR-CON M20 20 MEQ tablet TAKE 1 TABLET BY MOUTH EVERY DAY 90 tablet 1   Multiple Vitamin (MULTIVITAMIN WITH MINERALS) TABS tablet Take 1 tablet by mouth in the morning.     simvastatin (ZOCOR) 40 MG tablet TAKE 1 TABLET BY MOUTH EVERYDAY AT BEDTIME 90 tablet 1   cephALEXin (KEFLEX) 500 MG capsule Take 500 mg by mouth. 3 capsules (Patient not taking: Reported on 08/22/2023)     docusate sodium (COLACE) 100 MG capsule Take 1 capsule (100 mg total) by mouth 2 (two) times daily.  (Patient not taking: Reported on 08/22/2023) 10 capsule 0   loperamide (IMODIUM) 2 MG capsule Take by mouth as needed.     polyethylene glycol (MIRALAX / GLYCOLAX) 17 g packet Take 17 g by mouth daily as needed for mild constipation. (Patient not taking: Reported on 08/22/2023) 14 each 0   cephALEXin (KEFLEX) 500 MG capsule Take 2,000 mg by mouth See admin instructions. Take 4 capsules (2000 mg) by mouth 1 hour prior to dental procedures (Patient not taking: Reported on 08/22/2023)     Cholecalciferol (VITAMIN D3) 50 MCG (2000 UT) TABS Take 2,000 Units by mouth in the morning. (Patient not taking: Reported on 08/22/2023)     COVID-19 mRNA bivalent vaccine, Pfizer, (PFIZER COVID-19 VAC BIVALENT) injection Inject into the muscle. (Patient not taking: Reported on 08/22/2023) 0.3 mL 0   COVID-19 mRNA Vac-TriS, Pfizer, (PFIZER-BIONT COVID-19 VAC-TRIS) SUSP injection Inject into the muscle. (Patient not taking: Reported on 08/22/2023) 0.3 mL 0   HYDROcodone-acetaminophen (NORCO/VICODIN) 5-325 MG tablet Take 1-2 tablets by mouth every 6 (six) hours as needed for severe pain. (Patient not taking: Reported on 08/22/2023) 42 tablet 0   methocarbamol (ROBAXIN) 500 MG tablet Take 1 tablet (500 mg total) by mouth every 6 (six) hours as needed for muscle spasms. (Patient not taking: Reported on 08/22/2023) 40 tablet 0   No facility-administered medications prior to visit.     Allergies:   Vancomycin, Hydrocodone-acetaminophen, Sulfa antibiotics, Clindamycin/lincomycin, Codeine, Doxycycline, Penicillins, Rifampin, and Rocephin [ceftriaxone sodium in dextrose]   Social History   Socioeconomic History   Marital status: Divorced    Spouse name: Not on file   Number of children: Not on file   Years of education: Not on file   Highest education level: Not on file  Occupational History   Not on file  Tobacco Use   Smoking status: Never   Smokeless tobacco: Never  Vaping Use   Vaping status: Never Used   Substance and Sexual Activity   Alcohol use: No   Drug use: No   Sexual activity: Not Currently  Other Topics Concern   Not on file  Social History Narrative   Not on file   Social Determinants of Health   Financial Resource Strain: Not on file  Food Insecurity: Not on file  Transportation Needs: Not on file  Physical Activity: Not on file  Stress: Not on file  Social Connections: Not on file     Family History:  The patient's family history includes COPD in her sister; Heart disease in her father and mother; Stroke in her father and mother.   ROS:   Please see the history of present illness.    ROS All other systems reviewed and are negative.   PHYSICAL EXAM:   VS:  BP (!) 147/73   Pulse 84   Wt 137 lb 9.6 oz (  62.4 kg)   SpO2 95%   BMI 25.58 kg/m      General: Alert, oriented x3, no distress Head: no evidence of trauma, PERRL, EOMI, no exophtalmos or lid lag, no myxedema, no xanthelasma; normal ears, nose and oropharynx Neck: normal jugular venous pulsations and no hepatojugular reflux; brisk carotid pulses without delay and no carotid bruits Chest: clear to auscultation, no signs of consolidation by percussion or palpation, normal fremitus, symmetrical and full respiratory excursions Cardiovascular: normal position and quality of the apical impulse, regular rhythm, normal first and second heart sounds, 1/6 aortic ejection murmur, no diastolic murmurs, rubs or gallops Abdomen: no tenderness or distention, no masses by palpation, no abnormal pulsatility or arterial bruits, normal bowel sounds, no hepatosplenomegaly Extremities: no clubbing, cyanosis or edema; 2+ radial, ulnar and brachial pulses bilaterally; 2+ right femoral, posterior tibial and dorsalis pedis pulses; 2+ left femoral, posterior tibial and dorsalis pedis pulses; no subclavian or femoral bruits Neurological: grossly nonfocal Psych: Normal mood and affect     Wt Readings from Last 3 Encounters:   08/22/23 137 lb 9.6 oz (62.4 kg)  02/14/22 138 lb 9.6 oz (62.9 kg)  05/03/21 141 lb (64 kg)    HYPERTENSION CONTROL Vitals:   08/22/23 0857 08/22/23 0936  BP: (!) 158/80 (!) 147/73    The patient's blood pressure is elevated above target today.  In order to address the patient's elevated BP: Blood pressure will be monitored at home to determine if medication changes need to be made.       Studies/Labs Reviewed:   EKG: ECG ordered today shows normal sinus rhythm is a normal tracing, QTc 408 ms Recent Labs: 08/20/2023: ALT 11; BUN 21; Creatinine, Ser 0.54; Potassium 4.7; Sodium 140   Lipid Panel    Component Value Date/Time   CHOL 140 08/20/2023 0904   TRIG 73 08/20/2023 0904   HDL 45 08/20/2023 0904   CHOLHDL 3.1 08/20/2023 0904   CHOLHDL 3.2 06/27/2016 0805   VLDL 22 06/27/2016 0805   LDLCALC 80 08/20/2023 0904     ASSESSMENT:    1. Dyslipidemia (high LDL; low HDL)   2. Prediabetes   3. Essential hypertension   4. Hyperparathyroidism, primary (HCC)   5. Acute pain of right shoulder      PLAN:  In order of problems listed above:  HLP: Excellent LDL on statin, chronically low HDL cholesterol.  Has features of the metabolic syndrome which she has successfully largely kept at bay with exercise and healthy eating.  Continue statin. Prediabetes: A1c 6.1% HTN: Just last month blood pressure was 131/72.  Her blood pressure little high today but this can be attributed to shoulder pain. Hyperparathyroidism: Sees Dr. Sharl Ma.  On furosemide. Right shoulder pain: Sounds like a rotator cuff injury to me.  Advised her to see her orthopedic specialist.  She may benefit from physical therapy.    Medication Adjustments/Labs and Tests Ordered: Current medicines are reviewed at length with the patient today.  Concerns regarding medicines are outlined above.  Medication changes, Labs and Tests ordered today are listed in the Patient Instructions below. Patient Instructions   Medication Instructions:  No changes *If you need a refill on your cardiac medications before your next appointment, please call your pharmacy*  Follow-Up: At [facility], you and your health needs are our priority.  As part of our continuing mission to provide you with exceptional heart care, we have created designated Provider Care Teams.  These Care Teams include your primary  Cardiologist (physician) and Advanced Practice Providers (APPs -  Physician Assistants and Nurse Practitioners) who all work together to provide you with the care you need, when you need it.  We recommend signing up for the patient portal called "MyChart".  Sign up information is provided on this After Visit Summary.  MyChart is used to connect with patients for Virtual Visits (Telemedicine).  Patients are able to view lab/test results, encounter notes, upcoming appointments, etc.  Non-urgent messages can be sent to your provider as well.   To learn more about what you can do with MyChart, go to [email or web address].    Your next appointment:   1 year(s)  Provider:   Thurmon Fair, MD         Signed, Thurmon Fair, MD  08/22/2023 9:46 AM    [facility] [address] [place], [place], [place]  [address] Phone: [phone]; Fax: [phone]

## 2023-08-23 NOTE — Telephone Encounter (Signed)
Spoke with patient to advise her that I could not mail out her paper work with a medical release form. Patient stated she will come pick up the paper work.

## 2023-08-31 ENCOUNTER — Other Ambulatory Visit: Payer: Self-pay | Admitting: Cardiovascular Disease

## 2023-08-31 DIAGNOSIS — M25811 Other specified joint disorders, right shoulder: Secondary | ICD-10-CM | POA: Diagnosis not present

## 2023-08-31 DIAGNOSIS — M25511 Pain in right shoulder: Secondary | ICD-10-CM | POA: Diagnosis not present

## 2023-08-31 DIAGNOSIS — M25552 Pain in left hip: Secondary | ICD-10-CM | POA: Diagnosis not present

## 2023-08-31 DIAGNOSIS — Z96643 Presence of artificial hip joint, bilateral: Secondary | ICD-10-CM | POA: Diagnosis not present

## 2023-09-04 DIAGNOSIS — E119 Type 2 diabetes mellitus without complications: Secondary | ICD-10-CM | POA: Diagnosis not present

## 2023-09-25 ENCOUNTER — Telehealth: Payer: Self-pay | Admitting: Cardiovascular Disease

## 2023-09-25 NOTE — Telephone Encounter (Signed)
Labs faxed to [facility] Physician Attn Dr Napoleon Form.

## 2023-09-25 NOTE — Telephone Encounter (Signed)
Mia, called requesting patient recent labs to be sent to fax # [phone] ATTN: Dr Napoleon Form.

## 2023-09-27 ENCOUNTER — Other Ambulatory Visit: Payer: Self-pay | Admitting: Obstetrics and Gynecology

## 2023-09-27 DIAGNOSIS — E210 Primary hyperparathyroidism: Secondary | ICD-10-CM | POA: Diagnosis not present

## 2023-09-27 DIAGNOSIS — Z13820 Encounter for screening for osteoporosis: Secondary | ICD-10-CM

## 2023-09-27 DIAGNOSIS — E119 Type 2 diabetes mellitus without complications: Secondary | ICD-10-CM | POA: Diagnosis not present

## 2023-09-27 DIAGNOSIS — Z1239 Encounter for other screening for malignant neoplasm of breast: Secondary | ICD-10-CM | POA: Diagnosis not present

## 2023-09-27 DIAGNOSIS — Z01419 Encounter for gynecological examination (general) (routine) without abnormal findings: Secondary | ICD-10-CM | POA: Diagnosis not present

## 2023-10-01 DIAGNOSIS — I10 Essential (primary) hypertension: Secondary | ICD-10-CM | POA: Diagnosis not present

## 2023-10-01 DIAGNOSIS — E119 Type 2 diabetes mellitus without complications: Secondary | ICD-10-CM | POA: Diagnosis not present

## 2023-10-01 DIAGNOSIS — E210 Primary hyperparathyroidism: Secondary | ICD-10-CM | POA: Diagnosis not present

## 2023-10-01 DIAGNOSIS — M8580 Other specified disorders of bone density and structure, unspecified site: Secondary | ICD-10-CM | POA: Diagnosis not present

## 2023-11-22 ENCOUNTER — Ambulatory Visit
Admission: RE | Admit: 2023-11-22 | Discharge: 2023-11-22 | Disposition: A | Discharge status: Home / Self Care | Payer: PPO | Source: Ambulatory Visit | Attending: Obstetrics and Gynecology | Admitting: Obstetrics and Gynecology

## 2023-11-22 DIAGNOSIS — Z1231 Encounter for screening mammogram for malignant neoplasm of breast: Secondary | ICD-10-CM

## 2023-11-26 ENCOUNTER — Other Ambulatory Visit: Payer: Self-pay | Admitting: Obstetrics and Gynecology

## 2023-11-26 DIAGNOSIS — R928 Other abnormal and inconclusive findings on diagnostic imaging of breast: Secondary | ICD-10-CM

## 2023-12-06 ENCOUNTER — Ambulatory Visit
Admission: RE | Admit: 2023-12-06 | Discharge: 2023-12-06 | Disposition: A | Discharge status: Home / Self Care | Payer: PPO | Source: Ambulatory Visit | Attending: Obstetrics and Gynecology | Admitting: Obstetrics and Gynecology

## 2023-12-06 ENCOUNTER — Ambulatory Visit
Admission: RE | Admit: 2023-12-06 | Discharge: 2023-12-06 | Disposition: A | Discharge status: Home / Self Care | Payer: Self-pay | Source: Ambulatory Visit | Attending: Obstetrics and Gynecology | Admitting: Obstetrics and Gynecology

## 2023-12-06 ENCOUNTER — Other Ambulatory Visit: Payer: Self-pay | Admitting: Obstetrics and Gynecology

## 2023-12-06 DIAGNOSIS — R928 Other abnormal and inconclusive findings on diagnostic imaging of breast: Secondary | ICD-10-CM

## 2024-04-14 DIAGNOSIS — E119 Type 2 diabetes mellitus without complications: Secondary | ICD-10-CM | POA: Diagnosis not present

## 2024-04-14 DIAGNOSIS — E210 Primary hyperparathyroidism: Secondary | ICD-10-CM | POA: Diagnosis not present

## 2024-04-14 DIAGNOSIS — M8580 Other specified disorders of bone density and structure, unspecified site: Secondary | ICD-10-CM | POA: Diagnosis not present

## 2024-04-17 DIAGNOSIS — M8580 Other specified disorders of bone density and structure, unspecified site: Secondary | ICD-10-CM | POA: Diagnosis not present

## 2024-04-17 DIAGNOSIS — I10 Essential (primary) hypertension: Secondary | ICD-10-CM | POA: Diagnosis not present

## 2024-04-17 DIAGNOSIS — E210 Primary hyperparathyroidism: Secondary | ICD-10-CM | POA: Diagnosis not present

## 2024-04-17 DIAGNOSIS — E119 Type 2 diabetes mellitus without complications: Secondary | ICD-10-CM | POA: Diagnosis not present

## 2024-05-08 ENCOUNTER — Other Ambulatory Visit: Payer: Self-pay

## 2024-05-08 MED ORDER — POTASSIUM CHLORIDE CRYS ER 20 MEQ PO TBCR: 20.0000 meq | EXTENDED_RELEASE_TABLET | Freq: Every day | ORAL | 0 refills | Status: DC

## 2024-06-05 ENCOUNTER — Other Ambulatory Visit: Payer: Self-pay | Admitting: Cardiovascular Disease

## 2024-07-10 ENCOUNTER — Other Ambulatory Visit: Payer: Self-pay | Admitting: Cardiovascular Disease

## 2024-07-10 DIAGNOSIS — E785 Hyperlipidemia, unspecified: Secondary | ICD-10-CM | POA: Diagnosis not present

## 2024-07-10 DIAGNOSIS — R350 Frequency of micturition: Secondary | ICD-10-CM | POA: Diagnosis not present

## 2024-07-10 DIAGNOSIS — G8929 Other chronic pain: Secondary | ICD-10-CM | POA: Diagnosis not present

## 2024-07-10 DIAGNOSIS — E1165 Type 2 diabetes mellitus with hyperglycemia: Secondary | ICD-10-CM | POA: Diagnosis not present

## 2024-07-10 DIAGNOSIS — Z0000 Encounter for general adult medical examination without abnormal findings: Secondary | ICD-10-CM | POA: Diagnosis not present

## 2024-07-10 DIAGNOSIS — L812 Freckles: Secondary | ICD-10-CM | POA: Diagnosis not present

## 2024-07-10 DIAGNOSIS — I10 Essential (primary) hypertension: Secondary | ICD-10-CM | POA: Diagnosis not present

## 2024-07-10 DIAGNOSIS — E210 Primary hyperparathyroidism: Secondary | ICD-10-CM | POA: Diagnosis not present

## 2024-07-10 DIAGNOSIS — Z23 Encounter for immunization: Secondary | ICD-10-CM | POA: Diagnosis not present

## 2024-07-10 DIAGNOSIS — M170 Bilateral primary osteoarthritis of knee: Secondary | ICD-10-CM | POA: Diagnosis not present

## 2024-07-10 DIAGNOSIS — L821 Other seborrheic keratosis: Secondary | ICD-10-CM | POA: Diagnosis not present

## 2024-07-21 ENCOUNTER — Telehealth: Payer: Self-pay | Admitting: Cardiovascular Disease

## 2024-07-21 DIAGNOSIS — R7303 Prediabetes: Secondary | ICD-10-CM

## 2024-07-21 DIAGNOSIS — E785 Hyperlipidemia, unspecified: Secondary | ICD-10-CM

## 2024-07-21 DIAGNOSIS — Z79899 Other long term (current) drug therapy: Secondary | ICD-10-CM

## 2024-07-21 NOTE — Telephone Encounter (Signed)
 CMET, lipid panel and A1c, please (hypercholesterolemia and prediabetes)

## 2024-07-21 NOTE — Telephone Encounter (Signed)
 Pt requesting to have blood work orders placed in the system before her upcoming appt.

## 2024-07-22 NOTE — Telephone Encounter (Signed)
 Croitoru, Mihai, MD to [place], RN    07/21/24  5:00 PM Note CMET, lipid panel and A1c, please (hypercholesterolemia and prediabetes)    Has an appt on 10/20/24: orders placed, she will need to be fasting   Called the patient and informed about the information above. Will need to be fasting and can have drawn at any [facility]. She verbalized understanding.

## 2024-07-31 DIAGNOSIS — R140 Abdominal distension (gaseous): Secondary | ICD-10-CM | POA: Diagnosis not present

## 2024-07-31 DIAGNOSIS — Z1211 Encounter for screening for malignant neoplasm of colon: Secondary | ICD-10-CM | POA: Diagnosis not present

## 2024-07-31 DIAGNOSIS — K5900 Constipation, unspecified: Secondary | ICD-10-CM | POA: Diagnosis not present

## 2024-08-11 DIAGNOSIS — R7303 Prediabetes: Secondary | ICD-10-CM | POA: Diagnosis not present

## 2024-08-11 DIAGNOSIS — Z79899 Other long term (current) drug therapy: Secondary | ICD-10-CM | POA: Diagnosis not present

## 2024-08-11 DIAGNOSIS — E785 Hyperlipidemia, unspecified: Secondary | ICD-10-CM | POA: Diagnosis not present

## 2024-08-11 LAB — LIPID PANEL

## 2024-08-12 ENCOUNTER — Ambulatory Visit: Payer: Self-pay | Admitting: Cardiovascular Disease

## 2024-08-12 LAB — LIPID PANEL
Cholesterol, Total: 106 mg/dL (ref 100–199)
HDL: 37 mg/dL — AB (ref >39)
LDL CALC COMMENT:: 2.9 ratio (ref 0.0–4.4)
LDL Chol Calc (NIH): 53 mg/dL (ref 0–99)
Triglycerides: 79 mg/dL (ref 0–149)
VLDL Cholesterol Cal: 16 mg/dL (ref 5–40)

## 2024-08-12 LAB — COMPREHENSIVE METABOLIC PANEL WITH GFR
ALT: 8 IU/L (ref 0–32)
AST: 15 IU/L (ref 0–40)
Albumin: 4 g/dL (ref 3.8–4.8)
Alkaline Phosphatase: 88 IU/L (ref 49–135)
BUN/Creatinine Ratio: 27 (ref 12–28)
BUN: 14 mg/dL (ref 8–27)
Bilirubin Total: 0.5 mg/dL (ref 0.0–1.2)
CO2: 25 mmol/L (ref 20–29)
Calcium: 11.4 mg/dL — AB (ref 8.7–10.3)
Chloride: 104 mmol/L (ref 96–106)
Creatinine, Ser: 0.51 mg/dL — AB (ref 0.57–1.00)
Globulin, Total: 2.7 g/dL (ref 1.5–4.5)
Glucose: 100 mg/dL — AB (ref 70–99)
Potassium: 4.5 mmol/L (ref 3.5–5.2)
Sodium: 141 mmol/L (ref 134–144)
Total Protein: 6.7 g/dL (ref 6.0–8.5)
eGFR: 96 mL/min/1.73 (ref >59)

## 2024-08-12 LAB — HEMOGLOBIN A1C
Est. average glucose Bld gHb Est-mCnc: 131 mg/dL
Hgb A1c MFr Bld: 6.2 % — ABNORMAL HIGH (ref 4.8–5.6)

## 2024-08-13 ENCOUNTER — Other Ambulatory Visit: Payer: Self-pay | Admitting: Cardiovascular Disease

## 2024-08-13 NOTE — Telephone Encounter (Signed)
 Pt would like a c/b to f/u on Test results Please advise

## 2024-08-15 NOTE — Progress Notes (Signed)
 Low risk for CV complications with colonoscopy. OK to hold ASA for 5-7 days if requested.

## 2024-08-15 NOTE — Telephone Encounter (Signed)
  Mihai Croitoru, MD 08/12/2024 11:43 AM EDT     LDL (bad) cholesterol is great and that is the most important. The  HDL cholesterol (good cholesterol) is a little low. Blood glucose test is a little worse, but still in prediabetes range. Both the HDL and the glucose will improve with more physical exercise. No change in meds is necessary.    Went over results with the patient. She asked me to mail results to her. She also states that her provider who is performing her Colonoscopy asked for pre-op clearance and needs this information as well.  Informed her that her GI office should have faxed us  a request for pre-op clearance. Informed her that I would send this information to her GI provider our pre-op department. She verbalized understanding.

## 2024-08-18 NOTE — Telephone Encounter (Signed)
-----   Message from Nurse Comer F sent at 08/15/2024  6:12 PM EDT ----- Regarding: RE: preop The provider is Layla Lah, MD, [facility] ----- Message ----- From: Wynetta Niels HERO, CMA Sent: 08/15/2024   9:28 AM EDT To: Damien JAYSON Braver, NP; Comer LITTIE Ebbs, RN; # Subject: preop                                          Erskin Comer,   I do not see that we received a clearance request from GI office. We will need the request from GI as well. Who is doing her procedure?  ----- Message ----- From: Braver Damien JAYSON, NP Sent: 08/15/2024   9:21 AM EDT To: Cv Div Preop Callback

## 2024-08-19 ENCOUNTER — Telehealth (HOSPITAL_BASED_OUTPATIENT_CLINIC_OR_DEPARTMENT_OTHER): Payer: Self-pay | Admitting: *Deleted

## 2024-08-19 NOTE — Telephone Encounter (Signed)
 Tried to reach Dr. Elicia office in regard to needing a preop clearance request to be faxed to our office .   I was not able to get anyone on the line. I will fax a note to GI office pt sent a MY CHART message that she has a procedure to be done with Dr. Elicia.   Our office has yet to receive a clearance request for the preop team and cardiologist to review. Please fax today to preop team fax# [phone] clearance request.

## 2024-08-19 NOTE — Telephone Encounter (Signed)
 You routed this conversation to [name], MD Me    08/19/24 11:22 AM Note Tried to reach Dr. Elicia office in regard to needing a preop clearance request to be faxed to our office .    I was not able to get anyone on the line. I will fax a note to GI office pt sent a MY CHART message that she has a procedure to be done with Dr. Elicia.    Our office has yet to receive a clearance request for the preop team and cardiologist to review. Please fax today to preop team fax# [phone] clearance request.               08/19/24 11:16 AM You contacted DR[facility]  Me    08/19/24 11:16 AM Note ----- Message from Niels CHRISTELLA Jest, CMA sent at 08/18/2024  8:38 AM EDT -----     ----- Message ----- From: Davee Comer CROME, RN Sent: 08/15/2024   6:13 PM EDT To: Damien JAYSON Braver, NP; Niels CHRISTELLA Jest, CMA; Cv Di# Subject: RE: preop                                       The provider is Claw Elicia, MD, [facility] ----- Message ----- From: Jest Niels CHRISTELLA, CMA Sent: 08/15/2024   9:28 AM EDT To: Damien JAYSON Braver, NP; Comer CROME Davee, RN; # Subject: preop                                           Erskin Comer,    I do not see that we received a clearance request from GI office. We will need the request from GI as well. Who is doing her procedure?  ----- Message ----- From: Braver Damien JAYSON, NP Sent: 08/15/2024   9:21 AM EDT To: Cv Div Preop Callback           08/18/24  8:38 AM You routed this conversation to Cv Div Preop Callback (Selected Message)

## 2024-08-19 NOTE — Telephone Encounter (Signed)
-----   Message from Niels CHRISTELLA Jest, CMA sent at 08/18/2024  8:38 AM EDT -----   ----- Message ----- From: Davee [name] CROME, RN Sent: 08/15/2024   6:13 PM EDT To: Damien JAYSON Braver, NP; Niels CHRISTELLA Jest, CMA; Cv Di# Subject: RE: preop                                      The provider is Layla Lah, MD, [facility] ----- Message ----- From: Jest Niels CHRISTELLA, CMA Sent: 08/15/2024   9:28 AM EDT To: Damien JAYSON Braver, NP; [name] CROME Davee, RN; # Subject: preop                                          Doris [name],   I do not see that we received a clearance request from GI office. We will need the request from GI as well. Who is doing her procedure?  ----- Message ----- From: Braver Damien JAYSON, NP Sent: 08/15/2024   9:21 AM EDT To: Cv Div Preop Callback

## 2024-08-22 ENCOUNTER — Other Ambulatory Visit: Payer: Self-pay | Admitting: Cardiovascular Disease

## 2024-08-27 ENCOUNTER — Ambulatory Visit: Attending: Cardiovascular Disease | Admitting: Cardiovascular Disease

## 2024-08-27 ENCOUNTER — Encounter: Payer: Self-pay | Admitting: Cardiovascular Disease

## 2024-08-27 VITALS — BP 138/70 | HR 83 | Ht 61.0 in | Wt 140.6 lb

## 2024-08-27 DIAGNOSIS — E210 Primary hyperparathyroidism: Secondary | ICD-10-CM

## 2024-08-27 DIAGNOSIS — E785 Hyperlipidemia, unspecified: Secondary | ICD-10-CM | POA: Diagnosis not present

## 2024-08-27 DIAGNOSIS — R7303 Prediabetes: Secondary | ICD-10-CM

## 2024-08-27 DIAGNOSIS — I10 Essential (primary) hypertension: Secondary | ICD-10-CM

## 2024-08-27 NOTE — Progress Notes (Signed)
 Cardiology Office Note    Date:  08/27/2024   ID:  Doris [name], DOB 1945-10-25, MRN [id number]  PCP:  Cleotilde, [place]  E, PA  Cardiologist:   Jerel Balding, MD   Chief Complaint  Patient presents with   Hypertension   Hyperlipidemia    History of Present Illness:  Doris [name] is a 78 y.o. female with hypertension, hyperlipidemia previous history of severe obesity (now minimally overweight only), hyperparathyroidism returning for routine follow-up.   She has been having some problems with back pain which has limited her ability to walk, but is thinking of going back to the [facility] to use the supine bicycle and walk in the pool.  She does not have any issues with exertional angina or dyspnea or any symptoms at rest.  She has not had been troubled by palpitations, dizziness or syncope.  She has good blood pressure control.  Her recent lipid profile was not bad with LDL of 53, but her HDL is lower than before 37 and her hemoglobin A1c is in prediabetes range at 6.2%.  She has normal renal function.  She is on chronic furosemide  therapy to help with hypercalcemia due to hyperparathyroidism.  She has not had problems with nephrolithiasis, abdominal pain or osteoporosis.  She has an upcoming colonoscopy.  She lives with her son and has 3 granddaughters, 2 of which are still living at home, 1 of which she is on campus at [facility] [employer] [employer] [facility].   Past Medical History:  Diagnosis Date   Abnormal uterine bleeding    Condyloma    Degenerative disc disease    C-spine, back, hip surgery   Depression    Dysplasia of cervix, low grade (CIN 1)    Hypercalcemia    Hyperlipidemia    Hypertension    Migraine    MRSA (methicillin resistant Staphylococcus aureus) 10/24/2007   after back surg- #3 I&D surgewries after   Parathyroid  disease    hyperparathyroid   Pneumonia    Pre-diabetes     Past Surgical History:  Procedure Laterality Date   CARDIOVASCULAR STRESS TEST  05/24/2011   No  scintigraphic evidence of inducible myocardial ischemia. Alithough there are no reversible perfusion defects, the presence of a TID ratio >1.2 is concerning for multivessel disease.   CERVICAL SPINE SURGERY     CESAREAN SECTION     GYNECOLOGIC CRYOSURGERY     SPINE SURGERY  10/24/2007   x4   TOTAL HIP ARTHROPLASTY Left    TOTAL HIP ARTHROPLASTY Right 05/03/2021   Procedure: TOTAL HIP ARTHROPLASTY ANTERIOR APPROACH;  Surgeon: Ernie Cough, MD;  Location: [facility];  Service: Orthopedics;  Laterality: Right;   UPPER EXTREMITY VENOUS DOPPLER  11/14/2008   No evidence of DVT or superficial thrombosis of the right upper extremity    Current Medications: Outpatient Medications Prior to Visit  Medication Sig Dispense Refill   alendronate (FOSAMAX) 70 MG tablet Take 70 mg by mouth once a week.     aspirin  81 MG chewable tablet Chew 81 mg by mouth daily in the afternoon.     benazepril  (LOTENSIN ) 20 MG tablet TAKE 1 TABLET BY MOUTH EVERY DAY 90 tablet 3   BISACODYL  5 MG EC tablet Take 5 mg by mouth as directed.     cephALEXin (KEFLEX) 500 MG capsule Take 500 mg by mouth. 3 capsules     furosemide  (LASIX ) 20 MG tablet TAKE 1 TABLET BY MOUTH EVERY DAY 90 tablet 0   GAVILYTE-G 236 g solution  as directed.     loperamide (IMODIUM) 2 MG capsule Take by mouth as needed.     Multiple Vitamin (MULTIVITAMIN WITH MINERALS) TABS tablet Take 1 tablet by mouth in the morning.     potassium chloride  SA (KLOR-CON  M) 20 MEQ tablet TAKE 1 TABLET BY MOUTH EVERY DAY 90 tablet 0   simvastatin  (ZOCOR ) 40 MG tablet TAKE 1 TABLET BY MOUTH EVERYDAY AT BEDTIME 90 tablet 3   polyethylene glycol (MIRALAX  / GLYCOLAX ) 17 g packet Take 17 g by mouth daily as needed for mild constipation. (Patient not taking: Reported on 08/27/2024) 14 each 0   docusate sodium  (COLACE) 100 MG capsule Take 1 capsule (100 mg total) by mouth 2 (two) times daily. (Patient not taking: Reported on 08/22/2023) 10 capsule 0   No facility-administered  medications prior to visit.     Allergies:   Vancomycin , Hydrocodone -acetaminophen , Sulfa antibiotics, Clindamycin/lincomycin, Codeine, Doxycycline, Penicillins, Rifampin, and Rocephin [ceftriaxone sodium in dextrose ]  \  Family History:  The patient's family history includes COPD in her sister; Heart disease in her father and mother; Stroke in her father and mother.   ROS:   Please see the history of present illness.    ROS All other systems reviewed and are negative.   PHYSICAL EXAM:   VS:  BP 138/70   Pulse 83   Ht 5' 1 (1.549 m)   Wt 140 lb 9.6 oz (63.8 kg)   SpO2 94%   BMI 26.57 kg/m       General: Alert, oriented x3, no distress, minimally overweight Head: no evidence of trauma, PERRL, EOMI, no exophtalmos or lid lag, no myxedema, no xanthelasma; normal ears, nose and oropharynx Neck: normal jugular venous pulsations and no hepatojugular reflux; brisk carotid pulses without delay and no carotid bruits Chest: clear to auscultation, no signs of consolidation by percussion or palpation, normal fremitus, symmetrical and full respiratory excursions Cardiovascular: normal position and quality of the apical impulse, regular rhythm, normal first and second heart sounds, no murmurs, rubs or gallops Abdomen: no tenderness or distention, no masses by palpation, no abnormal pulsatility or arterial bruits, normal bowel sounds, no hepatosplenomegaly Extremities: no clubbing, cyanosis or edema; 2+ radial, ulnar and brachial pulses bilaterally; 2+ right femoral, posterior tibial and dorsalis pedis pulses; 2+ left femoral, posterior tibial and dorsalis pedis pulses; no subclavian or femoral bruits Neurological: grossly nonfocal Psych: Normal mood and affect      Wt Readings from Last 3 Encounters:  08/27/24 140 lb 9.6 oz (63.8 kg)  08/22/23 137 lb 9.6 oz (62.4 kg)  02/14/22 138 lb 9.6 oz (62.9 kg)         Studies/Labs Reviewed:   EKG:   EKG Interpretation Date/Time:  Wednesday  August 27 2024 10:15:34 EST Ventricular Rate:  83 PR Interval:  148 QRS Duration:  82 QT Interval:  354 QTC Calculation: 415 R Axis:   -1  Text Interpretation: Normal sinus rhythm Normal ECG When compared with ECG of 22-Aug-2023 09:02, No significant change was found Confirmed by Elanor Cale ([id number]) on 08/27/2024 10:21:45 AM        Recent Labs: 08/11/2024: ALT 8; BUN 14; Creatinine, Ser 0.51; Potassium 4.5; Sodium 141   Lipid Panel    Component Value Date/Time   CHOL 106 08/11/2024 1058   TRIG 79 08/11/2024 1058   HDL 37 (L) 08/11/2024 1058   CHOLHDL 2.9 08/11/2024 1058   CHOLHDL 3.2 06/27/2016 0805   VLDL 22 06/27/2016 0805   LDLCALC 53 08/11/2024  1058     ASSESSMENT:    1. Dyslipidemia (high LDL; low HDL)   2. Essential hypertension   3. Prediabetes   4. Hyperparathyroidism, primary      PLAN:  In order of problems listed above:  HLP: LDL is excellent.  Continue statin.  She has managed to improve her HDL with her program of regular exercise, but due to her back problems she has been less active and her HDL is low again.  Discussed ways to get exercise while protecting her back. Prediabetes: A1c 6.2% without medications. HTN: Well-controlled on current dose of benazepril , continue. Hyperparathyroidism: Sees Dr. Faythe.  Medically managed on furosemide  and potassium supplements.     Medication Adjustments/Labs and Tests Ordered: Current medicines are reviewed at length with the patient today.  Concerns regarding medicines are outlined above.  Medication changes, Labs and Tests ordered today are listed in the Patient Instructions below. Patient Instructions  Medication Instructions:  Your physician recommends that you continue on your current medications as directed. Please refer to the Current Medication list given to you today.  *If you need a refill on your cardiac medications before your next appointment, please call your pharmacy*  Follow-Up: At [facility], you and your health needs are our priority.  As part of our continuing mission to provide you with exceptional heart care, our providers are all part of one team.  This team includes your primary Cardiologist (physician) and Advanced Practice Providers or APPs (Physician Assistants and Nurse Practitioners) who all work together to provide you with the care you need, when you need it.  Your next appointment:   1 year  Provider:   Jerel Balding, MD       Signed, Jerel Balding, MD  08/27/2024 5:03 PM    [facility] [address] [place], [place], [place]  [address] Phone: [phone]; Fax: [phone]

## 2024-08-27 NOTE — Patient Instructions (Signed)
 Medication Instructions:  Your physician recommends that you continue on your current medications as directed. Please refer to the Current Medication list given to you today.  *If you need a refill on your cardiac medications before your next appointment, please call your pharmacy*  Follow-Up: At [facility], you and your health needs are our priority.  As part of our continuing mission to provide you with exceptional heart care, our providers are all part of one team.  This team includes your primary Cardiologist (physician) and Advanced Practice Providers or APPs (Physician Assistants and Nurse Practitioners) who all work together to provide you with the care you need, when you need it.  Your next appointment:   1 year  Provider:   Jerel Balding, MD

## 2024-08-29 NOTE — Telephone Encounter (Signed)
 I s/w  Sherri surgery scheduler for DR. Brahmbhatt who states Dr. Elicia did not need cardiac clearance.   However, see notes below from Dr. Lavina providing clearance.     Francyne Headland, MD  Physician Cardiology   Progress Notes Signed   Encounter Date: 08/12/2024   Signed      Low risk for CV complications with colonoscopy. OK to hold ASA for 5-7 days if requested.        Electronically signed by Francyne Headland, MD at 08/15/2024 10:1

## 2024-09-01 DIAGNOSIS — K635 Polyp of colon: Secondary | ICD-10-CM | POA: Diagnosis not present

## 2024-09-01 DIAGNOSIS — Z1211 Encounter for screening for malignant neoplasm of colon: Secondary | ICD-10-CM | POA: Diagnosis not present

## 2024-09-01 DIAGNOSIS — K5730 Diverticulosis of large intestine without perforation or abscess without bleeding: Secondary | ICD-10-CM | POA: Diagnosis not present

## 2024-09-01 DIAGNOSIS — K6389 Other specified diseases of intestine: Secondary | ICD-10-CM | POA: Diagnosis not present

## 2024-09-01 DIAGNOSIS — K648 Other hemorrhoids: Secondary | ICD-10-CM | POA: Diagnosis not present

## 2024-09-03 DIAGNOSIS — K635 Polyp of colon: Secondary | ICD-10-CM | POA: Diagnosis not present

## 2024-09-11 ENCOUNTER — Other Ambulatory Visit: Payer: Self-pay | Admitting: Obstetrics and Gynecology

## 2024-09-11 DIAGNOSIS — Z1231 Encounter for screening mammogram for malignant neoplasm of breast: Secondary | ICD-10-CM

## 2024-10-19 ENCOUNTER — Other Ambulatory Visit: Payer: Self-pay | Admitting: Cardiovascular Disease

## 2024-10-20 ENCOUNTER — Ambulatory Visit: Admitting: Cardiovascular Disease

## 2024-11-16 ENCOUNTER — Other Ambulatory Visit: Payer: Self-pay | Admitting: Cardiovascular Disease

## 2024-11-24 ENCOUNTER — Ambulatory Visit

## 2024-12-02 ENCOUNTER — Ambulatory Visit
